# Patient Record
Sex: Female | Born: 1955 | Race: White | Hispanic: No | Marital: Married | Smoking: Never smoker
Health system: Southern US, Community
[De-identification: ages and names within clinical notes are randomized; demographics above are authoritative.]

## PROBLEM LIST (undated history)

## (undated) DIAGNOSIS — Z9889 Other specified postprocedural states: Secondary | ICD-10-CM

## (undated) DIAGNOSIS — R112 Nausea with vomiting, unspecified: Secondary | ICD-10-CM

## (undated) HISTORY — PX: ABDOMINAL HYSTERECTOMY: SHX81

## (undated) HISTORY — PX: DENTAL SURGERY: SHX609

---

## 2000-09-18 ENCOUNTER — Encounter: Admission: RE | Admit: 2000-09-18 | Discharge: 2000-09-18 | Payer: Self-pay | Admitting: Obstetrics and Gynecology

## 2000-09-18 ENCOUNTER — Encounter: Payer: Self-pay | Admitting: Obstetrics and Gynecology

## 2001-09-20 ENCOUNTER — Encounter: Admission: RE | Admit: 2001-09-20 | Discharge: 2001-09-20 | Payer: Self-pay | Admitting: Obstetrics and Gynecology

## 2001-09-20 ENCOUNTER — Encounter: Payer: Self-pay | Admitting: Obstetrics and Gynecology

## 2002-09-26 ENCOUNTER — Encounter: Admission: RE | Admit: 2002-09-26 | Discharge: 2002-09-26 | Payer: Self-pay | Admitting: Obstetrics and Gynecology

## 2002-09-26 ENCOUNTER — Encounter: Payer: Self-pay | Admitting: Obstetrics and Gynecology

## 2003-06-28 HISTORY — PX: KNEE SURGERY: SHX244

## 2003-07-11 ENCOUNTER — Ambulatory Visit (HOSPITAL_COMMUNITY): Admission: RE | Admit: 2003-07-11 | Discharge: 2003-07-11 | Payer: Self-pay | Admitting: Obstetrics and Gynecology

## 2003-07-24 ENCOUNTER — Encounter: Admission: RE | Admit: 2003-07-24 | Discharge: 2003-07-24 | Payer: Self-pay | Admitting: Surgery

## 2003-09-04 ENCOUNTER — Observation Stay (HOSPITAL_COMMUNITY): Admission: RE | Admit: 2003-09-04 | Discharge: 2003-09-05 | Payer: Self-pay | Admitting: Surgery

## 2003-09-04 ENCOUNTER — Encounter (INDEPENDENT_AMBULATORY_CARE_PROVIDER_SITE_OTHER): Payer: Self-pay | Admitting: Specialist

## 2003-10-13 ENCOUNTER — Ambulatory Visit (HOSPITAL_COMMUNITY): Admission: RE | Admit: 2003-10-13 | Discharge: 2003-10-13 | Payer: Self-pay | Admitting: Obstetrics and Gynecology

## 2004-10-13 ENCOUNTER — Ambulatory Visit (HOSPITAL_COMMUNITY): Admission: RE | Admit: 2004-10-13 | Discharge: 2004-10-13 | Payer: Self-pay | Admitting: Obstetrics and Gynecology

## 2005-10-26 ENCOUNTER — Ambulatory Visit (HOSPITAL_COMMUNITY): Admission: RE | Admit: 2005-10-26 | Discharge: 2005-10-26 | Payer: Self-pay | Admitting: Obstetrics and Gynecology

## 2006-11-14 ENCOUNTER — Ambulatory Visit (HOSPITAL_COMMUNITY): Admission: RE | Admit: 2006-11-14 | Discharge: 2006-11-14 | Payer: Self-pay | Admitting: Obstetrics and Gynecology

## 2007-03-28 ENCOUNTER — Ambulatory Visit (HOSPITAL_COMMUNITY): Admission: RE | Admit: 2007-03-28 | Discharge: 2007-03-28 | Payer: Self-pay | Admitting: Surgery

## 2007-11-11 ENCOUNTER — Encounter: Admission: RE | Admit: 2007-11-11 | Discharge: 2007-11-11 | Payer: Self-pay | Admitting: Orthopedic Surgery

## 2007-11-20 ENCOUNTER — Ambulatory Visit (HOSPITAL_COMMUNITY): Admission: RE | Admit: 2007-11-20 | Discharge: 2007-11-20 | Payer: Self-pay | Admitting: Anesthesiology

## 2008-06-27 HISTORY — PX: FOOT SURGERY: SHX648

## 2008-11-20 ENCOUNTER — Ambulatory Visit (HOSPITAL_COMMUNITY): Admission: RE | Admit: 2008-11-20 | Discharge: 2008-11-20 | Payer: Self-pay | Admitting: Obstetrics and Gynecology

## 2009-12-15 ENCOUNTER — Ambulatory Visit (HOSPITAL_COMMUNITY): Admission: RE | Admit: 2009-12-15 | Discharge: 2009-12-15 | Payer: Self-pay | Admitting: Obstetrics and Gynecology

## 2010-11-12 NOTE — Op Note (Signed)
NAME:  Rachel Mccann, Rachel Mccann                        ACCOUNT NO.:  [id number]   MEDICAL RECORD NO.:  [id number]                   PATIENT TYPE:  OBV   LOCATION:  0372                                 FACILITY:  [facility]   PHYSICIAN:  Thornton Park. Daphine Deutscher, M.D.             DATE OF BIRTH:  [birth year]   DATE OF PROCEDURE:  09/04/2003  DATE OF DISCHARGE:                                 OPERATIVE REPORT   PREOPERATIVE DIAGNOSIS:  Recurrent chronic appendicitis.   POSTOPERATIVE DIAGNOSIS:  Recurrent chronic appendicitis with appendix stuck  to the lateral wall of the abdomen.   PROCEDURE:  Laparoscopic appendectomy.   SURGEON:  Thornton Park. Daphine Deutscher, M.D.   ANESTHESIA:  General endotracheal.   DESCRIPTION OF PROCEDURE:  [name] was taken to room #1 where she was given  general anesthesia, a Foley catheter was inserted, and the abdomen was  prepped widely with Betadine-draped sterilely.  A longitudinal incision was  made down into her umbilicus, where I found a small umbilical hernia about 1  cm in diameter.  Hasson technique was used.  The abdomen was insufflated.  We surveyed the abdomen and noted no abnormalities in the pelvis.  In the  cecum, there were inflammatory changes along the side wall, consistent with  acute inflammatory changes.  Went ahead and put a 5 mm port in the right  upper quadrant and a 10/11 obliquely in the left lower quadrant.  All sites  were injected with Marcaine as these were placed.  I then teased the  appendix down, where it was for double back on itself and stuck up to the  side wall.  We then teased it off the cecum inferiorly, as it was fairly  plastered to that structure, and was able to go through the mesentery with a  combination of clips and the harmonic scalpel.  When I had skeletonized the  mesentery of the appendix, I was able to isolate the base and staple across  it with the endo stapler using vascular cartridge.  The appendix was placed  in a bag and brought  out through the umbilicus.  It was sent to pathology,  where Dr. __________ looked at it and did not see any neoplastic structures.  It was all consistent with a chronic appendicitis.   Following irrigation and observation, the bleeding appeared to be  controlled.  I then removed the camera and then worked on the umbilicus.  I  freed up the skin from around the margin of this ring of fascia and placed  three sutures of 2-0 and 0 PDS and approximated the fascia and then closed  the skin over with 4-0 Vicryl.  I then went through  the closure from within, looked in the right lower quadrant.  Irrigated.  Then deflated the abdomen.  Trocars were removed without difficulty.  The  skin was closed with 4-0 Vicryl, Benzoin, and Steri-Strips.  The patient  seemed to  tolerate the procedure well.  She was taken to the recovery room  in satisfactory condition.                                               Thornton Park Daphine Deutscher, M.D.    MBM/MEDQ  D:  09/04/2003  T:  09/04/2003  Job:  [id number]   cc:   Sandria Bales. Ezzard Standing, M.D.  [address]  [place]  [place] [address]  Fax: [phone]   S. Kyra Manges, M.D.  [phone]  [place]  [place] [address]  Fax: [phone]

## 2011-07-06 ENCOUNTER — Other Ambulatory Visit (HOSPITAL_COMMUNITY): Payer: Self-pay | Admitting: Obstetrics and Gynecology

## 2011-07-06 DIAGNOSIS — Z1231 Encounter for screening mammogram for malignant neoplasm of breast: Secondary | ICD-10-CM

## 2011-08-04 ENCOUNTER — Ambulatory Visit (HOSPITAL_COMMUNITY)
Admission: RE | Admit: 2011-08-04 | Discharge: 2011-08-04 | Disposition: A | Discharge status: Home / Self Care | Payer: BC Managed Care – PPO | Source: Ambulatory Visit | Attending: Obstetrics and Gynecology | Admitting: Obstetrics and Gynecology

## 2011-08-04 DIAGNOSIS — Z1231 Encounter for screening mammogram for malignant neoplasm of breast: Secondary | ICD-10-CM | POA: Insufficient documentation

## 2011-12-07 ENCOUNTER — Other Ambulatory Visit: Payer: Self-pay | Admitting: Obstetrics and Gynecology

## 2012-07-12 ENCOUNTER — Other Ambulatory Visit (HOSPITAL_COMMUNITY): Payer: Self-pay | Admitting: Obstetrics and Gynecology

## 2012-07-12 DIAGNOSIS — Z1231 Encounter for screening mammogram for malignant neoplasm of breast: Secondary | ICD-10-CM

## 2012-08-06 ENCOUNTER — Ambulatory Visit (HOSPITAL_COMMUNITY)
Admission: RE | Admit: 2012-08-06 | Discharge: 2012-08-06 | Disposition: A | Discharge status: Home / Self Care | Payer: BC Managed Care – PPO | Source: Ambulatory Visit | Attending: Obstetrics and Gynecology | Admitting: Obstetrics and Gynecology

## 2012-08-06 DIAGNOSIS — Z1231 Encounter for screening mammogram for malignant neoplasm of breast: Secondary | ICD-10-CM

## 2012-12-21 ENCOUNTER — Other Ambulatory Visit: Payer: Self-pay | Admitting: Obstetrics and Gynecology

## 2013-10-10 ENCOUNTER — Other Ambulatory Visit (HOSPITAL_COMMUNITY): Payer: Self-pay | Admitting: Obstetrics and Gynecology

## 2013-10-10 DIAGNOSIS — Z1231 Encounter for screening mammogram for malignant neoplasm of breast: Secondary | ICD-10-CM

## 2013-10-11 ENCOUNTER — Ambulatory Visit (HOSPITAL_COMMUNITY)
Admission: RE | Admit: 2013-10-11 | Discharge: 2013-10-11 | Disposition: A | Discharge status: Home / Self Care | Payer: 59 | Source: Ambulatory Visit | Attending: Obstetrics and Gynecology | Admitting: Obstetrics and Gynecology

## 2013-10-11 DIAGNOSIS — Z1231 Encounter for screening mammogram for malignant neoplasm of breast: Secondary | ICD-10-CM | POA: Insufficient documentation

## 2013-11-26 ENCOUNTER — Ambulatory Visit (INDEPENDENT_AMBULATORY_CARE_PROVIDER_SITE_OTHER): Payer: 59

## 2013-11-26 VITALS — BP 102/62 | HR 65 | Resp 13 | Ht 66.0 in | Wt 170.0 lb

## 2013-11-26 DIAGNOSIS — M25473 Effusion, unspecified ankle: Secondary | ICD-10-CM

## 2013-11-26 DIAGNOSIS — M25476 Effusion, unspecified foot: Secondary | ICD-10-CM

## 2013-11-26 DIAGNOSIS — G5760 Lesion of plantar nerve, unspecified lower limb: Secondary | ICD-10-CM

## 2013-11-26 DIAGNOSIS — M779 Enthesopathy, unspecified: Secondary | ICD-10-CM

## 2013-11-26 DIAGNOSIS — M7750 Other enthesopathy of unspecified foot: Secondary | ICD-10-CM

## 2013-11-26 DIAGNOSIS — M778 Other enthesopathies, not elsewhere classified: Secondary | ICD-10-CM

## 2013-11-26 MED ORDER — MELOXICAM 15 MG PO TABS: 15.0000 mg | ORAL_TABLET | Freq: Every day | ORAL | Status: DC

## 2013-11-26 NOTE — Progress Notes (Deleted)
   Subjective:    Patient ID: Rachel Mccann, female    DOB: March 11, 1956, 58 y.o.   MRN: [id number]  HPI    Review of Systems  Musculoskeletal: Positive for arthralgias and gait problem.  All other systems reviewed and are negative.      Objective:   Physical Exam        Assessment & Plan:

## 2013-11-26 NOTE — Patient Instructions (Signed)

## 2013-11-26 NOTE — Progress Notes (Signed)
   Subjective:    Patient ID: Rachel Mccann, female    DOB: [birth year], 58 y.o.   MRN: [id number]  HPI Comments: N toe and forefoot pain L left 2nd toe and 2nd MPJ area plantar D 4 months O on and off C tightness, thickness at 2nd MPJ A no known trigger T Ice and heat, soaks, Ibuprofen  Pt states feet may be a little swollen to due flight last night.  Foot Pain Associated symptoms include arthralgias.      Review of Systems  Musculoskeletal: Positive for arthralgias and back pain.  All other systems reviewed and are negative.      Objective:   Physical Exam Neurovascular status is intact pedal pulses palpable epicritic and proprioceptive sensations intact and symmetric bilateral is normal plantar response and DTRs there is no history of injury trauma or contusion but may have extended her second toe which is slightly longer than the hallux there is pain on resisted plantarflexion and any temperature flexion plantar flexion the second MTP area left foot there is also some pain second interspace on direct lateral compression posteriorly neuroma pedal pulses otherwise palpable epicritic and proprioceptive sensations intact and symmetrical bilateral x-rays reveal no signs of fracture or osseous abnormality or soft tissue swelling and clinical exam of second MTP joint due to plantar capsular joint appears to be painful tender and edematous and tender on direct palpation. Patient also some diffuse keratoses mild digital contractures otherwise noted. 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 although there is lateral deviation hallux starting to push against the second digits which may be causing capsulitis      Assessment & Plan:  Assessment capsulitis second MTP joint bilateral possible pre-dislocation syndrome second MTP joint with early neuroma symptomology and edema and soft tissue swelling around second MTP joint plan at this time patient placed on a regimen  of MOBIC 15 minutes once daily indicate Advil helps slightly a little trauma but which is slightly stronger if no improvement consider a Sterapred for steroid dose pack as alternative possibly the future steroid injection may be considered at this time fascial strapping her digital splinting of the second toe is foot into plantar grade position utilizing a Elastoplast and cover tape patient is instructed on strapping the digit to maintain integrity plantar capsule recheck in 3 or 4 weeks for followup maintain a stiff soled shoe and the ice pack every you every day.  Purchase Miral [name] DPM

## 2013-12-11 ENCOUNTER — Ambulatory Visit (INDEPENDENT_AMBULATORY_CARE_PROVIDER_SITE_OTHER): Payer: 59 | Admitting: Podiatrist

## 2013-12-11 ENCOUNTER — Encounter: Payer: Self-pay | Admitting: Podiatrist

## 2013-12-11 VITALS — BP 93/57 | HR 71 | Resp 16

## 2013-12-11 DIAGNOSIS — M779 Enthesopathy, unspecified: Principal | ICD-10-CM

## 2013-12-11 DIAGNOSIS — M7750 Other enthesopathy of unspecified foot: Secondary | ICD-10-CM

## 2013-12-11 DIAGNOSIS — M778 Other enthesopathies, not elsewhere classified: Secondary | ICD-10-CM

## 2013-12-13 NOTE — Progress Notes (Signed)
Subjective: Patient presents today for followup of capsulitis second metatarsophalangeal joint and second digit of the left foot. She [facility] and [facility] the toe has been helping. She states she's been wearing stiff soled shoes however today she presents in a pair of cross type sandals. She relates that these are comfortable for her.  Objective: Neurovascular status is intact. Some generalized discomfort submetatarsal 2 and second digit is noted. Consistent with pre-dislocation injury.  Assessment: Pre-dislocation syndrome left  Plan: Recommended continued [facility] of [facility], and use of splinting for one to 2 more weeks. If it returns she will call we will consider a Sterapred Dosepak. May also be a candidate for orthotics in the future.

## 2013-12-23 ENCOUNTER — Other Ambulatory Visit: Payer: Self-pay | Admitting: Obstetrics and Gynecology

## 2013-12-24 LAB — CYTOLOGY - PAP

## 2014-02-10 ENCOUNTER — Telehealth: Payer: Self-pay | Admitting: *Deleted

## 2014-02-10 NOTE — Telephone Encounter (Signed)
I just need to see if I can get a refill for Meloxicam.  Thank You!

## 2014-02-11 ENCOUNTER — Telehealth: Payer: Self-pay | Admitting: *Deleted

## 2014-02-11 MED ORDER — MELOXICAM 15 MG PO TABS: 15.0000 mg | ORAL_TABLET | Freq: Every day | ORAL | Status: DC

## 2014-02-11 NOTE — Telephone Encounter (Signed)
Calling about a prescription refill for Meloxicam.  I'm still having issues with my foot.  I want to see if that can be arranged.

## 2014-02-11 NOTE — Telephone Encounter (Signed)
rx for meloxicam called into her pharmacy.  thanks

## 2014-02-11 NOTE — Telephone Encounter (Signed)
I called and informed the patient that Dr. Irving ShowsEgerton sent her prescription for Mobic to the pharmacy.  She stated oh great!  Thank you so much.

## 2014-08-01 ENCOUNTER — Other Ambulatory Visit: Payer: Self-pay | Admitting: Dermatology

## 2014-09-12 ENCOUNTER — Other Ambulatory Visit (HOSPITAL_COMMUNITY): Payer: Self-pay | Admitting: Obstetrics and Gynecology

## 2014-09-12 DIAGNOSIS — Z1231 Encounter for screening mammogram for malignant neoplasm of breast: Secondary | ICD-10-CM

## 2014-10-13 ENCOUNTER — Ambulatory Visit (HOSPITAL_COMMUNITY)
Admission: RE | Admit: 2014-10-13 | Discharge: 2014-10-13 | Disposition: A | Discharge status: Home / Self Care | Payer: 59 | Source: Ambulatory Visit | Attending: Obstetrics and Gynecology | Admitting: Obstetrics and Gynecology

## 2014-10-13 DIAGNOSIS — Z1231 Encounter for screening mammogram for malignant neoplasm of breast: Secondary | ICD-10-CM | POA: Insufficient documentation

## 2015-01-15 ENCOUNTER — Other Ambulatory Visit: Payer: Self-pay | Admitting: Obstetrics and Gynecology

## 2015-01-19 LAB — CYTOLOGY - PAP

## 2016-02-19 ENCOUNTER — Ambulatory Visit (INDEPENDENT_AMBULATORY_CARE_PROVIDER_SITE_OTHER): Payer: 59 | Admitting: Podiatry

## 2016-02-19 ENCOUNTER — Ambulatory Visit: Payer: Self-pay

## 2016-02-19 ENCOUNTER — Encounter: Payer: Self-pay | Admitting: Podiatry

## 2016-02-19 ENCOUNTER — Ambulatory Visit (INDEPENDENT_AMBULATORY_CARE_PROVIDER_SITE_OTHER): Payer: 59

## 2016-02-19 VITALS — BP 97/62 | HR 74 | Resp 16

## 2016-02-19 DIAGNOSIS — M21619 Bunion of unspecified foot: Secondary | ICD-10-CM

## 2016-02-19 DIAGNOSIS — M79672 Pain in left foot: Secondary | ICD-10-CM

## 2016-02-19 DIAGNOSIS — M7752 Other enthesopathy of left foot: Secondary | ICD-10-CM | POA: Diagnosis not present

## 2016-02-19 DIAGNOSIS — M778 Other enthesopathies, not elsewhere classified: Secondary | ICD-10-CM

## 2016-02-19 DIAGNOSIS — M779 Enthesopathy, unspecified: Secondary | ICD-10-CM

## 2016-02-19 DIAGNOSIS — M79671 Pain in right foot: Secondary | ICD-10-CM

## 2016-02-19 MED ORDER — TRIAMCINOLONE ACETONIDE 10 MG/ML IJ SUSP
10.0000 mg | Freq: Once | INTRAMUSCULAR | Status: AC
  Administered 2016-02-19: 10 mg

## 2016-02-19 NOTE — Progress Notes (Addendum)
Subjective:     Patient ID: Rachel Mccann, female   DOB: 01/06/1956, 60 y.o.   MRN: [id number]  HPI patient presents stating she's concerned about her structural bunion on the left and she has a lot of discomfort in the joint of the left second metatarsal and on the outside of the left foot where she admits she's been walking differently   Review of Systems     Objective:   Physical Exam Neurovascular status found to be intact with muscle strength adequate range of motion within normal limits with patient found to have moderate structural bunion deformity left exquisite discomfort second metatarsophalangeal joint left with no digital displacement at the current time and pain in the peroneal insertion fifth metatarsal left with no indication of tendon dysfunction    Assessment:     Inflammatory capsulitis second MPJ left with long-term structural bunion deformity left and tendinitis peroneal which is most likely compensation in nature    Plan:     H&P x-rays reviewed and did a proximal nerve block left and then aspirated the second MPJ getting out of small amount of clear fluid and injected quarter cc dexamethasone Kenalog into the joint and injected the sheath of the tendon peroneal left 3 mg Kenalog 5 mg Xylocaine and instructed on reduced activity. Dispensed fascial brace to lift up the lateral side of the foot and reappoint in 2-3 weeks to see response    X-ray report indicates that there is a structural bunion deformity left with elevation of the angle and deviation the hallux against the second toe with patient also noted to have no indications of stress fracture digital displacement or other pathology

## 2016-03-03 ENCOUNTER — Encounter: Payer: Self-pay | Admitting: Podiatry

## 2016-03-03 ENCOUNTER — Ambulatory Visit (INDEPENDENT_AMBULATORY_CARE_PROVIDER_SITE_OTHER): Payer: 59 | Admitting: Podiatry

## 2016-03-03 DIAGNOSIS — M779 Enthesopathy, unspecified: Principal | ICD-10-CM

## 2016-03-03 DIAGNOSIS — M21619 Bunion of unspecified foot: Secondary | ICD-10-CM | POA: Diagnosis not present

## 2016-03-03 DIAGNOSIS — M778 Other enthesopathies, not elsewhere classified: Secondary | ICD-10-CM

## 2016-03-03 DIAGNOSIS — M7752 Other enthesopathy of left foot: Secondary | ICD-10-CM | POA: Diagnosis not present

## 2016-03-04 NOTE — Progress Notes (Signed)
Subjective:     Patient ID: Rachel Mccann, female   DOB: 19-Oct-1955, 60 y.o.   MRN: [id number]  HPI patient presents stating I'm doing well but I am still having some pain if I do a lot of walking   Review of Systems     Objective:   Physical Exam Neurovascular status intact with continued mild to moderate discomfort around the second MPJ left that's improved from previous but still present with patient noted to have deformity left    Assessment:     Low-grade inflammatory process second MPJ left that's improved but still present with mechanical dysfunction    Plan:     Advised on the importance of shoe gear usage and rigid bottom shoes and continued dispensing metatarsal pads at this time to reduce pressure and reappoint 4 weeks and may ultimately require shortening-type osteotomy

## 2016-03-31 ENCOUNTER — Ambulatory Visit (INDEPENDENT_AMBULATORY_CARE_PROVIDER_SITE_OTHER): Payer: 59 | Admitting: Podiatry

## 2016-03-31 ENCOUNTER — Ambulatory Visit: Payer: 59 | Admitting: Podiatry

## 2016-03-31 ENCOUNTER — Encounter: Payer: Self-pay | Admitting: Podiatry

## 2016-03-31 DIAGNOSIS — M7752 Other enthesopathy of left foot: Secondary | ICD-10-CM | POA: Diagnosis not present

## 2016-03-31 DIAGNOSIS — M21619 Bunion of unspecified foot: Secondary | ICD-10-CM

## 2016-03-31 DIAGNOSIS — M779 Enthesopathy, unspecified: Principal | ICD-10-CM

## 2016-03-31 DIAGNOSIS — M778 Other enthesopathies, not elsewhere classified: Secondary | ICD-10-CM

## 2016-04-01 NOTE — Progress Notes (Signed)
Subjective:     Patient ID: Rachel Mccann, female   DOB: July 12, 1955, 60 y.o.   MRN: [id number]  HPI patient states the pain has improved but if she tries to be active or wear different types of shoes she still has quite a bit of discomfort   Review of Systems     Objective:   Physical Exam Neurovascular status intact muscle strength adequate and patient found to have continued discomfort second MPJ left with fluid buildup around the joint and mild dislocation of the toe with structural bunion deformity also noted    Assessment:     Inflammatory condition secondary MPJ left that's improved but still present with mechanical dysfunction and structural dysfunction with bunion deformity noted    Plan:     H&P condition reviewed and went ahead and I've recommended orthotics for this patient to try to reduce stress against the joint. I did discuss long-term this may require surgical intervention but I think at this point it's premature and I'm hoping to control with offloading. Patient was scanned for custom orthotics and will be seen back when they are ready

## 2016-05-04 ENCOUNTER — Ambulatory Visit: Payer: 59 | Admitting: Podiatry

## 2016-05-11 ENCOUNTER — Ambulatory Visit (INDEPENDENT_AMBULATORY_CARE_PROVIDER_SITE_OTHER): Payer: 59 | Admitting: Podiatry

## 2016-05-11 ENCOUNTER — Encounter: Payer: Self-pay | Admitting: Podiatry

## 2016-05-11 DIAGNOSIS — M778 Other enthesopathies, not elsewhere classified: Secondary | ICD-10-CM

## 2016-05-11 DIAGNOSIS — M21619 Bunion of unspecified foot: Secondary | ICD-10-CM

## 2016-05-11 DIAGNOSIS — M779 Enthesopathy, unspecified: Principal | ICD-10-CM

## 2016-05-11 DIAGNOSIS — M7752 Other enthesopathy of left foot: Secondary | ICD-10-CM

## 2016-05-11 NOTE — Patient Instructions (Signed)

## 2016-05-12 NOTE — Progress Notes (Signed)
Subjective:     Patient ID: Rachel Mccann, female   DOB: 1956/01/16, 60 y.o.   MRN: [id number]  HPI presents for orthotic pickup   Review of Systems     Objective:   Physical Exam Moderate discomfort left    Assessment:     Capsulitis    Plan:     Dispensed orthotics with instructions

## 2016-05-27 ENCOUNTER — Telehealth: Payer: Self-pay | Admitting: *Deleted

## 2016-05-27 NOTE — Telephone Encounter (Signed)
Pt states Dr. Charlsie Merlesegal stated if she was having pain to call the office. Left message instructing pt to go back into a stiff bottom shoe, and ice, make another appt.

## 2017-01-25 ENCOUNTER — Encounter: Payer: Self-pay | Admitting: Podiatry

## 2017-01-25 ENCOUNTER — Ambulatory Visit (INDEPENDENT_AMBULATORY_CARE_PROVIDER_SITE_OTHER): Payer: 59 | Admitting: Podiatry

## 2017-01-25 VITALS — BP 96/61 | HR 78 | Resp 16

## 2017-01-25 DIAGNOSIS — M7752 Other enthesopathy of left foot: Secondary | ICD-10-CM | POA: Diagnosis not present

## 2017-01-25 DIAGNOSIS — M779 Enthesopathy, unspecified: Principal | ICD-10-CM

## 2017-01-25 DIAGNOSIS — M778 Other enthesopathies, not elsewhere classified: Secondary | ICD-10-CM

## 2017-01-25 MED ORDER — TRIAMCINOLONE ACETONIDE 10 MG/ML IJ SUSP
10.0000 mg | Freq: Once | INTRAMUSCULAR | Status: AC
  Administered 2017-01-25: 10 mg

## 2017-01-25 NOTE — Progress Notes (Signed)
Subjective:    Patient ID: Rachel BlonderPatrice M Mccann, female   DOB: 61 y.o.   MRN: [id number]   HPI patient presents stating she has a lot of pain around her second metatarsal joint left that's inflamed and painful when palpated    ROS      Objective:  Physical Exam neurovascular status intact with inflammatory capsulitis of the second MPJ left    Assessment:   Chronic capsulitis second MPJ left      Plan:  Proximal nerve block administered aspirated the joint getting out a small amount of clear fluid and injected with quarter cc deck some some Kenalog and applied thick padding to reduce pressure on the joint surface. Reappoint to recheck

## 2017-02-15 ENCOUNTER — Other Ambulatory Visit: Payer: Self-pay | Admitting: Gastroenterology

## 2017-03-17 ENCOUNTER — Encounter (HOSPITAL_COMMUNITY): Payer: Self-pay | Admitting: *Deleted

## 2017-03-17 ENCOUNTER — Encounter (HOSPITAL_COMMUNITY)
Admission: RE | Disposition: A | Discharge status: Home / Self Care | Payer: Self-pay | Source: Ambulatory Visit | Attending: Gastroenterology

## 2017-03-17 ENCOUNTER — Ambulatory Visit (HOSPITAL_COMMUNITY)
Admission: RE | Admit: 2017-03-17 | Discharge: 2017-03-17 | Disposition: A | Discharge status: Home / Self Care | Payer: 59 | Source: Ambulatory Visit | Attending: Gastroenterology | Admitting: Gastroenterology

## 2017-03-17 DIAGNOSIS — K640 First degree hemorrhoids: Secondary | ICD-10-CM | POA: Insufficient documentation

## 2017-03-17 DIAGNOSIS — Z791 Long term (current) use of non-steroidal anti-inflammatories (NSAID): Secondary | ICD-10-CM | POA: Insufficient documentation

## 2017-03-17 DIAGNOSIS — K621 Rectal polyp: Secondary | ICD-10-CM | POA: Diagnosis not present

## 2017-03-17 DIAGNOSIS — Z8719 Personal history of other diseases of the digestive system: Secondary | ICD-10-CM | POA: Diagnosis not present

## 2017-03-17 DIAGNOSIS — Z880 Allergy status to penicillin: Secondary | ICD-10-CM | POA: Diagnosis not present

## 2017-03-17 HISTORY — PX: HOT HEMOSTASIS: SHX5433

## 2017-03-17 HISTORY — PX: FLEXIBLE SIGMOIDOSCOPY: SHX5431

## 2017-03-17 SURGERY — SIGMOIDOSCOPY, FLEXIBLE
Anesthesia: Moderate Sedation

## 2017-03-17 MED ORDER — FENTANYL CITRATE (PF) 100 MCG/2ML IJ SOLN
INTRAMUSCULAR | Status: AC
  Filled 2017-03-17: qty 2

## 2017-03-17 MED ORDER — SODIUM CHLORIDE 0.9 % IV SOLN
INTRAVENOUS | Status: DC
  Administered 2017-03-17: 500 mL via INTRAVENOUS

## 2017-03-17 MED ORDER — DIPHENHYDRAMINE HCL 50 MG/ML IJ SOLN
INTRAMUSCULAR | Status: AC
  Filled 2017-03-17: qty 1

## 2017-03-17 MED ORDER — FENTANYL CITRATE (PF) 100 MCG/2ML IJ SOLN
INTRAMUSCULAR | Status: DC | PRN
  Administered 2017-03-17 (×2): 25 ug via INTRAVENOUS

## 2017-03-17 MED ORDER — MIDAZOLAM HCL 5 MG/ML IJ SOLN
INTRAMUSCULAR | Status: AC
Start: 2017-03-17 — End: ?
  Filled 2017-03-17: qty 2

## 2017-03-17 MED ORDER — MIDAZOLAM HCL 10 MG/2ML IJ SOLN
INTRAMUSCULAR | Status: DC | PRN
  Administered 2017-03-17 (×2): 1 mg via INTRAVENOUS
  Administered 2017-03-17: 2 mg via INTRAVENOUS

## 2017-03-17 NOTE — Op Note (Signed)
[facility] Patient Name: Rachel Mccann Procedure Date: 03/17/2017 MRN: [id number] Attending MD: Tresea Mall Dr., MD Date of Birth: 1956/01/14 CSN: [id number] Age: 61 Admit Type: Outpatient Procedure:                Flexible Sigmoidoscopy Indications:              Personal history of rectal adenoma Providers:                Fayrene Fearing L. Randa Evens Dr., MD, Janae Sauce. Steele Berg, RN,                            Jacqulyn Liner, Technician Referring MD:              Medicines:                Fentanyl 50 micrograms IV, Midazolam 4 mg IV Complications:            No immediate complications. Estimated Blood Loss:     Estimated blood loss: none. Procedure:                Pre-Anesthesia Assessment:                           - Prior to the procedure, a History and Physical                            was performed, and patient medications and                            allergies were reviewed. The patient's tolerance of                            previous anesthesia was also reviewed. The risks                            and benefits of the procedure and the sedation                            options and risks were discussed with the patient.                            All questions were answered, and informed consent                            was obtained. Prior Anticoagulants: The patient has                            taken no previous anticoagulant or antiplatelet                            agents. ASA Grade Assessment: I - A normal, healthy                            patient. After reviewing the risks and benefits,  the patient was deemed in satisfactory condition to                            undergo the procedure.                           After obtaining informed consent, the scope was                            passed under direct vision. The EC-[id number] ([id number])                            scope was introduced through the anus and advanced                 to the the sigmoid colon.We advanced to 35 cm. The                            flexible sigmoidoscopy was accomplished without                            difficulty. The patient tolerated the procedure                            well. Scope In: Scope Out: Findings:      The perianal and digital rectal examinations were normal.      A 3 mm polyp was found in the rectum. The polyp was sessile. Fulguration       to ablate the lesion by argon plasma was successful. The residual polyp       was very near her very large hemorrhoids. In fact most distal part       actually appeared to be partially on one of the hemorrhoidal piles.. The       area was vigorously fulgerated with the circumferential probe. Retroflex       view did reveal very large internal hemorrhoids.      Non-bleeding internal hemorrhoids were found during retroflexion. The       hemorrhoids were large and Grade I (internal hemorrhoids that do not       prolapse).      Scattered small-mouthed diverticula were found in the sigmoid colon. Impression:               - One 3 mm polyp in the rectum. Treated with argon                            plasma coagulation (APC).                           - Non-bleeding internal hemorrhoids.                           - No specimens collected. Moderate Sedation:      Moderate (conscious) sedation was administered by the endoscopy nurse       and supervised by the endoscopist. The following parameters were       monitored: oxygen saturation, heart rate, blood pressure, respiratory       rate, EKG, adequacy of  pulmonary ventilation, and response to care. Recommendation:           - Discharge patient to home (ambulatory).                           - Resume previous diet.                           - Continue present medications.                           - Miralax 1 capful (17 grams) in 8 ounces of water                            PO PRN.                           - Repeat flexible  sigmoidoscopy in 6 months for                            surveillance. Procedure Code(s):        --- Professional ---                           [phone], Sigmoidoscopy, flexible; with ablation of                            tumor(s), polyp(s), or other lesion(s) (includes                            pre- and post-dilation and guide wire passage, when                            performed) Diagnosis Code(s):        --- Professional ---                           K62.1, Rectal polyp                           K64.0, First degree hemorrhoids                           Z86.018, Personal history of other benign neoplasm CPT copyright 2016 [employer]. All rights reserved. The codes documented in this report are preliminary and upon coder review may  be revised to meet current compliance requirements. Tresea Mall Dr., MD 03/17/2017 1:48:30 PM This report has been signed electronically. Number of Addenda: 0

## 2017-03-17 NOTE — H&P (Signed)
Subjective:   Patient is a 61 y.o. female presents with Need for follow-up of rectal polyp. This was initially removed by colonoscopy path revealed this to be adenoma. No dysplasia in the polyp. The polyp was large initially approximately 2 1/2 to 3 cm in the path was a serrated adenoma. 3 months later there is still some slight residual polyp in the distal rectum. This procedure is done to evaluate and remove any residual polyp and to formulate the area with the APC. Procedure including risks and benefits discussed in office.  There are no active problems to display for this patient.  History reviewed. No pertinent past medical history.  Past Surgical History:  Procedure Laterality Date  . ABDOMINAL HYSTERECTOMY    . DENTAL SURGERY    . FOOT SURGERY Right 2010  . KNEE SURGERY Right 2005    Prescriptions Prior to Admission  Medication Sig Dispense Refill Last Dose  . Biotin 1000 MCG tablet Take 1,000 mcg by mouth daily.   03/17/2017 at Unknown time  . naproxen sodium (ANAPROX) 220 MG tablet Take 220 mg by mouth daily as needed (pain).   03/17/2017 at Unknown time  . VITAMIN D, CHOLECALCIFEROL, PO Take 1 tablet by mouth daily.   03/17/2017 at Unknown time  . VIVELLE-DOT 0.0375 MG/24HR Apply 1 patch topically every 3 (three) days.   03/17/2017 at Unknown time   Allergies  Allergen Reactions  . Penicillins Rash    Severe rash as a child Has patient had a PCN reaction causing immediate rash, facial/tongue/throat swelling, SOB or lightheadedness with hypotension: No Has patient had a PCN reaction causing severe rash involving mucus membranes or skin necrosis: No Has patient had a PCN reaction that required hospitalization: No Has patient had a PCN reaction occurring within the last 10 years: No If all of the above answers are "NO", then may proceed with Cephalosporin use.     Social History  Substance Use Topics  . Smoking status: Never Smoker  . Smokeless tobacco: Never Used  . Alcohol  use Yes     Comment: rare    History reviewed. No pertinent family history.   Objective:   Patient Vitals for the past 8 hrs:  BP Temp Temp src Pulse Resp SpO2 Height Weight  03/17/17 1236 124/66 (!) 97.5 F (36.4 C) Oral 71 10 98 %  (1.676 m) 77.1 kg (170 lb)   No intake/output data recorded. No intake/output data recorded.   See MD Preop evaluation      Assessment:   1. Rectal polyp.  Plan:   We will proceed with flexible sigmoidoscopy with polypectomy and fulgaration of any residual polyp with the APC the procedure has been discussed in detail with the patient and her husband

## 2017-03-20 ENCOUNTER — Encounter (HOSPITAL_COMMUNITY): Payer: Self-pay | Admitting: Gastroenterology

## 2017-07-05 ENCOUNTER — Encounter: Payer: Self-pay | Admitting: Podiatry

## 2017-07-05 ENCOUNTER — Ambulatory Visit (INDEPENDENT_AMBULATORY_CARE_PROVIDER_SITE_OTHER): Payer: 59 | Admitting: Podiatry

## 2017-07-05 DIAGNOSIS — M778 Other enthesopathies, not elsewhere classified: Secondary | ICD-10-CM

## 2017-07-05 DIAGNOSIS — M7752 Other enthesopathy of left foot: Secondary | ICD-10-CM

## 2017-07-05 DIAGNOSIS — M779 Enthesopathy, unspecified: Principal | ICD-10-CM

## 2017-07-05 MED ORDER — TRIAMCINOLONE ACETONIDE 10 MG/ML IJ SUSP
10.0000 mg | Freq: Once | INTRAMUSCULAR | Status: AC
  Administered 2017-07-05: 10 mg

## 2017-07-05 NOTE — Progress Notes (Signed)
Subjective:   Patient ID: Rachel BlonderPatrice M Lemme, female   DOB: 62 y.o.   MRN: [id number]   HPI Patient states the joint has started to bother her again and she is getting ready to go out of town   ROS      Objective:  Physical Exam  Second MPJ left is improved but there is still discomfort when I palpated the joint deeply     Assessment:  Inflammatory capsulitis second MPJ left     Plan:  Proximal nerve block administered aspirating the joint getting on a small amount of clear fluid and injected with 1/4 cc of dexamethasone Kenalog and applied thick padding to reduce pressure on the joint surface.  Explained some day this may require structural bunion correction and shortening osteotomy of the second metatarsal

## 2017-08-10 ENCOUNTER — Other Ambulatory Visit: Payer: Self-pay | Admitting: Gastroenterology

## 2017-08-18 ENCOUNTER — Ambulatory Visit (HOSPITAL_COMMUNITY): Admit: 2017-08-18 | Payer: 59 | Admitting: Gastroenterology

## 2017-08-18 ENCOUNTER — Encounter (HOSPITAL_COMMUNITY): Payer: Self-pay | Admitting: *Deleted

## 2017-08-18 ENCOUNTER — Encounter (HOSPITAL_COMMUNITY): Payer: Self-pay

## 2017-08-18 ENCOUNTER — Other Ambulatory Visit: Payer: Self-pay

## 2017-08-18 ENCOUNTER — Encounter (HOSPITAL_COMMUNITY)
Admission: RE | Disposition: A | Discharge status: Home / Self Care | Payer: Self-pay | Source: Ambulatory Visit | Attending: Gastroenterology

## 2017-08-18 ENCOUNTER — Ambulatory Visit (HOSPITAL_COMMUNITY)
Admission: RE | Admit: 2017-08-18 | Discharge: 2017-08-18 | Disposition: A | Discharge status: Home / Self Care | Payer: 59 | Source: Ambulatory Visit | Attending: Gastroenterology | Admitting: Gastroenterology

## 2017-08-18 DIAGNOSIS — K5730 Diverticulosis of large intestine without perforation or abscess without bleeding: Secondary | ICD-10-CM | POA: Diagnosis not present

## 2017-08-18 DIAGNOSIS — Z09 Encounter for follow-up examination after completed treatment for conditions other than malignant neoplasm: Secondary | ICD-10-CM | POA: Diagnosis not present

## 2017-08-18 DIAGNOSIS — Z86010 Personal history of colonic polyps: Secondary | ICD-10-CM | POA: Diagnosis not present

## 2017-08-18 DIAGNOSIS — K640 First degree hemorrhoids: Secondary | ICD-10-CM | POA: Insufficient documentation

## 2017-08-18 DIAGNOSIS — Z8719 Personal history of other diseases of the digestive system: Secondary | ICD-10-CM | POA: Insufficient documentation

## 2017-08-18 HISTORY — PX: FLEXIBLE SIGMOIDOSCOPY: SHX5431

## 2017-08-18 SURGERY — SIGMOIDOSCOPY, FLEXIBLE
Anesthesia: Moderate Sedation

## 2017-08-18 MED ORDER — MIDAZOLAM HCL 10 MG/2ML IJ SOLN
INTRAMUSCULAR | Status: DC | PRN
  Administered 2017-08-18 (×2): 2 mg via INTRAVENOUS

## 2017-08-18 MED ORDER — FENTANYL CITRATE (PF) 100 MCG/2ML IJ SOLN
INTRAMUSCULAR | Status: AC
  Filled 2017-08-18: qty 4

## 2017-08-18 MED ORDER — MIDAZOLAM HCL 5 MG/ML IJ SOLN
INTRAMUSCULAR | Status: AC
  Filled 2017-08-18: qty 3

## 2017-08-18 MED ORDER — SODIUM CHLORIDE 0.9 % IV SOLN
INTRAVENOUS | Status: DC
  Administered 2017-08-18: 500 mL via INTRAVENOUS

## 2017-08-18 MED ORDER — FENTANYL CITRATE (PF) 100 MCG/2ML IJ SOLN
INTRAMUSCULAR | Status: DC | PRN
  Administered 2017-08-18 (×2): 25 ug via INTRAVENOUS

## 2017-08-18 NOTE — Op Note (Signed)
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 The path report                            showed serrated adenoma. Sigmoidoscopy was                            performed 9/18 revealing slight residual adenoma                            extending into the anal canal. The patient very                            large internal hemorrhoids. All of the residual                            polyp was fulgerated with the APC. This is a 6                            month follow-up to make certain that all the polyp                            is been adequately removed. Providers:                Llana AlimentJames L. Jakyiah Briones Dr., MD, Cathlean Marseillesebi Mays, RN, Zoila ShutterGary                            Bryant, Technician Referring MD:              Medicines:                Fentanyl 50 micrograms IV, Midazolam 4 mg IV Complications:            No immediate complications. Estimated Blood Loss:     Estimated blood loss: none. Procedure:                Pre-Anesthesia Assessment:                           - Prior to the procedure, a History and Physical                            was performed, and patient medications and                            allergies were reviewed. The patient's tolerance of  previous anesthesia was also reviewed. The risks                            and benefits of the procedure and the sedation                            options and risks were discussed with the patient.                            All questions were answered, and informed consent                            was obtained. Prior Anticoagulants: The patient has               taken no previous anticoagulant or antiplatelet                            agents. ASA Grade Assessment: I - A normal, healthy                            patient. After reviewing the risks and benefits,                            the patient was deemed in satisfactory condition to                            undergo the procedure.                           After obtaining informed consent, the scope was                            passed under direct vision. The EC-[id number] ([id number])                            scope was introduced through the anus and advanced                            to the the sigmoid colon. The flexible                            sigmoidoscopy was accomplished without difficulty.                            The patient tolerated the procedure well. The                            quality of the bowel preparation was good. Scope In: Scope Out: Findings:      The perianal and digital rectal examinations were normal.      There is no endoscopic evidence of bleeding, mass, polyps or tumor in       the rectum, in the recto-sigmoid colon and in the sigmoid colon. We were       able to advance to  approximately 30 cm and there was some solid stool at       that distance. The area of the prior rectal polyp was carefully examined       in the forward and retroflex view there were no signs of any residual       polyp.      Non-bleeding internal hemorrhoids were found during retroflexion. The       hemorrhoids were large and Grade I (internal hemorrhoids that do not       prolapse).      A few small-mouthed diverticula were found in the sigmoid colon. Impression:               - Non-bleeding internal hemorrhoids.                           - Diverticulosis in the sigmoid colon.                           - No specimens collected.                           - Personal history of colonic polyps. The rectal                            polyp has adequately been treated note was  no sign                            of any residual polyp. Moderate Sedation:      Moderate (conscious) sedation was administered by the endoscopy nurse       and supervised by the endoscopist. The following parameters were       monitored: oxygen saturation, heart rate, blood pressure, respiratory       rate, EKG, adequacy of pulmonary ventilation, and response to care. Recommendation:           - Discharge patient to home (ambulatory).                           - Resume regular diet.                           - Continue present medications.                           - Perform a colonoscopy in 2 years. Procedure Code(s):        --- Professional ---                           [phone], Sigmoidoscopy, flexible; diagnostic,                            including collection of specimen(s) by brushing or                            washing, when performed (separate procedure) Diagnosis Code(s):        --- Professional ---  Z86.010, Personal history of colonic polyps                           K64.0, First degree hemorrhoids                           K57.30, Diverticulosis of large intestine without                            perforation or abscess without bleeding CPT copyright 2016 [employer]. All rights reserved. The codes documented in this report are preliminary and upon coder review may  be revised to meet current compliance requirements. Tresea Mall Dr., MD 08/18/2017 11:45:11 AM This report has been signed electronically. Number of Addenda: 0

## 2017-08-18 NOTE — H&P (Signed)
Subjective:   Patient is a 62 y.o. female presents with history a rectal polyp that was serrated. It was not clear that all of this should been removed so 9/18 flexible sigmoidoscopy was performed in the whole area was fulgerated with the circumferential probe with the APC. This is done is a six-month follow-up to make sure that all the polyp has been removed.. Procedure including risks and benefits discussed in office.  There are no active problems to display for this patient.  History reviewed. No pertinent past medical history.  Past Surgical History:  Procedure Laterality Date  . ABDOMINAL HYSTERECTOMY    . DENTAL SURGERY    . FLEXIBLE SIGMOIDOSCOPY N/A 03/17/2017   Procedure: FLEXIBLE SIGMOIDOSCOPY;  Surgeon: Carman ChingEdwards, Kimetha Trulson, MD;  Location: [facility];  Service: Endoscopy;  Laterality: N/A;  . FOOT SURGERY Right 2010  . HOT HEMOSTASIS N/A 03/17/2017   Procedure: HOT HEMOSTASIS (ARGON PLASMA COAGULATION/BICAP);  Surgeon: Carman ChingEdwards, Antavion Bartoszek, MD;  [facility];  Service: Endoscopy;  Laterality: N/A;  . KNEE SURGERY Right 2005    Medications Prior to Admission  Medication Sig Dispense Refill Last Dose  . Biotin 1000 MCG tablet Take 1,000 mcg by mouth daily.   Past Week at Unknown time  . Cholecalciferol (VITAMIN D3 PO) Take 1 capsule by mouth daily.   Past Week at Unknown time  . naproxen sodium (ANAPROX) 220 MG tablet Take 220 mg by mouth daily as needed (for pain or headache).    Past Week at Unknown time  . VIVELLE-DOT 0.0375 MG/24HR Apply 1 patch topically 2 (two) times a week.    08/18/2017 at Unknown time   Allergies  Allergen Reactions  . Penicillins Rash and Other (See Comments)    Severe rash as a child Has patient had a PCN reaction causing immediate rash, facial/tongue/throat swelling, SOB or lightheadedness with hypotension: No Has patient had a PCN reaction causing severe rash involving mucus membranes or skin necrosis: No Has patient had a PCN reaction that required  hospitalization: No Has patient had a PCN reaction occurring within the last 10 years: No If all of the above answers are "NO", then may proceed with Cephalosporin use.     Social History   Tobacco Use  . Smoking status: Never Smoker  . Smokeless tobacco: Never Used  Substance Use Topics  . Alcohol use: Yes    Comment: rare    History reviewed. No pertinent family history.   Objective:   Patient Vitals for the past 8 hrs:  BP Temp Temp src Resp SpO2 Height Weight  08/18/17 1059 (!) 112/52 97.7 F (36.5 C) Oral 11 98 % 5\' 6"  (1.676 m) 74.8 kg (165 lb)   No intake/output data recorded. No intake/output data recorded.   See MD Preop evaluation      Assessment:   1. Rectal polyps status post APC fulgaration 6 months ago  Plan:   Will proceed with sigmoidoscopy with fulgaration with APC if needed. Have discussed with the patient and husband.

## 2017-08-18 NOTE — Discharge Instructions (Signed)
Repeat colonoscopy in 2 years

## 2019-03-29 ENCOUNTER — Other Ambulatory Visit: Payer: Self-pay | Admitting: Gastroenterology

## 2019-04-09 ENCOUNTER — Other Ambulatory Visit (HOSPITAL_COMMUNITY): Payer: BC Managed Care – PPO

## 2019-04-11 ENCOUNTER — Other Ambulatory Visit (HOSPITAL_COMMUNITY)
Admission: RE | Admit: 2019-04-11 | Discharge: 2019-04-11 | Disposition: A | Discharge status: Home / Self Care | Payer: BC Managed Care – PPO | Source: Ambulatory Visit | Attending: Gastroenterology | Admitting: Gastroenterology

## 2019-04-11 DIAGNOSIS — Z20828 Contact with and (suspected) exposure to other viral communicable diseases: Secondary | ICD-10-CM | POA: Diagnosis not present

## 2019-04-11 DIAGNOSIS — Z01812 Encounter for preprocedural laboratory examination: Secondary | ICD-10-CM | POA: Diagnosis not present

## 2019-04-12 ENCOUNTER — Other Ambulatory Visit: Payer: Self-pay

## 2019-04-12 ENCOUNTER — Encounter (HOSPITAL_COMMUNITY): Payer: Self-pay | Admitting: *Deleted

## 2019-04-13 LAB — NOVEL CORONAVIRUS, NAA (HOSP ORDER, SEND-OUT TO REF LAB; TAT 18-24 HRS): SARS-CoV-2, NAA: NOT DETECTED

## 2019-04-14 ENCOUNTER — Encounter (HOSPITAL_COMMUNITY): Payer: Self-pay | Admitting: Anesthesiology

## 2019-04-14 NOTE — Anesthesia Preprocedure Evaluation (Deleted)
Anesthesia Evaluation    Reviewed: Allergy & Precautions, Patient's Chart, lab work & pertinent test results  History of Anesthesia Complications (+) PONV  Airway        Dental   Pulmonary neg pulmonary ROS,           Cardiovascular Exercise Tolerance: Good negative cardio ROS       Neuro/Psych negative neurological ROS  negative psych ROS   GI/Hepatic negative GI ROS, Neg liver ROS,   Endo/Other  negative endocrine ROS  Renal/GU negative Renal ROS  negative genitourinary   Musculoskeletal negative musculoskeletal ROS (+)   Abdominal   Peds  Hematology negative hematology ROS (+)   Anesthesia Other Findings   Reproductive/Obstetrics                             Anesthesia Physical Anesthesia Plan  ASA: II  Anesthesia Plan: MAC   Post-op Pain Management:    Induction:   PONV Risk Score and Plan:   Airway Management Planned: Natural Airway and Nasal Cannula  Additional Equipment:   Intra-op Plan:   Post-operative Plan:   Informed Consent:   Plan Discussed with:   Anesthesia Plan Comments: (Hx of rectal polyp excision 6 month follow up colonoscopy)        Anesthesia Quick Evaluation

## 2019-04-15 ENCOUNTER — Ambulatory Visit (HOSPITAL_COMMUNITY)
Admission: RE | Admit: 2019-04-15 | Discharge: 2019-04-15 | Disposition: A | Discharge status: Home / Self Care | Payer: BC Managed Care – PPO | Attending: Gastroenterology | Admitting: Gastroenterology

## 2019-04-15 ENCOUNTER — Encounter (HOSPITAL_COMMUNITY): Payer: Self-pay | Admitting: Emergency Medicine

## 2019-04-15 ENCOUNTER — Other Ambulatory Visit: Payer: Self-pay

## 2019-04-15 ENCOUNTER — Encounter (HOSPITAL_COMMUNITY)
Admission: RE | Disposition: A | Discharge status: Home / Self Care | Payer: Self-pay | Source: Home / Self Care | Attending: Gastroenterology

## 2019-04-15 HISTORY — DX: Other specified postprocedural states: Z98.890

## 2019-04-15 HISTORY — DX: Nausea with vomiting, unspecified: R11.2

## 2019-04-15 SURGERY — CANCELLED PROCEDURE
Anesthesia: Monitor Anesthesia Care

## 2019-04-15 MED ORDER — PROPOFOL 10 MG/ML IV BOLUS
INTRAVENOUS | Status: AC
  Filled 2019-04-15: qty 40

## 2019-04-15 SURGICAL SUPPLY — 22 items

## 2019-04-15 NOTE — Progress Notes (Signed)
Pt stated she could only drink less than 1/2 of prep and had only one formed bowel movement at 0545 and it was brown and formed. Dr. Oletta Lamas made aware. Plan to reschedule for tomorrow at 1115 . Pt will get new prep today. She will quarantine the rest of the day til tomorrow and will stay on clear liquids and drink new prep. Pt will be back at hospital at [place] tomorrow.

## 2019-04-15 NOTE — Progress Notes (Signed)
Pre-op endo call comepleted Patient explains she was supposed to have colonoscopy today but was cancelled because she did not finish the pre-op . She states that she has been drinking the new prep they gave her today but has consumed less than half the prep as she states " its very hard to do this but Dr Osborne Oman called me and said that I just need to drink as much of it as I can since I already drank some yesterday" . Patient also states that she has not had a bowel movement since 5am this morning. RN advised patient to call endoscopy early tomorrow morning if she does not have a bowel movement between now and tomorrow morning.

## 2019-04-16 ENCOUNTER — Ambulatory Visit (HOSPITAL_COMMUNITY): Payer: BC Managed Care – PPO | Admitting: Anesthesiology

## 2019-04-16 ENCOUNTER — Encounter (HOSPITAL_COMMUNITY): Payer: Self-pay

## 2019-04-16 ENCOUNTER — Other Ambulatory Visit: Payer: Self-pay

## 2019-04-16 ENCOUNTER — Ambulatory Visit (HOSPITAL_COMMUNITY)
Admission: RE | Admit: 2019-04-16 | Discharge: 2019-04-16 | Disposition: A | Discharge status: Home / Self Care | Payer: BC Managed Care – PPO | Attending: Gastroenterology | Admitting: Gastroenterology

## 2019-04-16 ENCOUNTER — Ambulatory Visit (HOSPITAL_COMMUNITY): Admit: 2019-04-16 | Payer: BC Managed Care – PPO | Admitting: Gastroenterology

## 2019-04-16 ENCOUNTER — Encounter (HOSPITAL_COMMUNITY)
Admission: RE | Disposition: A | Discharge status: Home / Self Care | Payer: Self-pay | Source: Home / Self Care | Attending: Gastroenterology

## 2019-04-16 DIAGNOSIS — Z79899 Other long term (current) drug therapy: Secondary | ICD-10-CM | POA: Insufficient documentation

## 2019-04-16 DIAGNOSIS — Z86010 Personal history of colonic polyps: Secondary | ICD-10-CM | POA: Diagnosis present

## 2019-04-16 DIAGNOSIS — Z880 Allergy status to penicillin: Secondary | ICD-10-CM | POA: Insufficient documentation

## 2019-04-16 DIAGNOSIS — Z1211 Encounter for screening for malignant neoplasm of colon: Secondary | ICD-10-CM | POA: Insufficient documentation

## 2019-04-16 DIAGNOSIS — K5140 Inflammatory polyps of colon without complications: Secondary | ICD-10-CM | POA: Diagnosis not present

## 2019-04-16 DIAGNOSIS — K640 First degree hemorrhoids: Secondary | ICD-10-CM | POA: Diagnosis not present

## 2019-04-16 DIAGNOSIS — K5730 Diverticulosis of large intestine without perforation or abscess without bleeding: Secondary | ICD-10-CM | POA: Insufficient documentation

## 2019-04-16 DIAGNOSIS — K621 Rectal polyp: Secondary | ICD-10-CM | POA: Insufficient documentation

## 2019-04-16 HISTORY — PX: POLYPECTOMY: SHX5525

## 2019-04-16 HISTORY — PX: COLONOSCOPY WITH PROPOFOL: SHX5780

## 2019-04-16 SURGERY — COLONOSCOPY WITH PROPOFOL
Anesthesia: Monitor Anesthesia Care

## 2019-04-16 SURGERY — COLONOSCOPY
Anesthesia: Monitor Anesthesia Care

## 2019-04-16 MED ORDER — PROPOFOL 500 MG/50ML IV EMUL
INTRAVENOUS | Status: DC | PRN
  Administered 2019-04-16: 110 ug/kg/min via INTRAVENOUS

## 2019-04-16 MED ORDER — ONDANSETRON HCL 4 MG/2ML IJ SOLN
INTRAMUSCULAR | Status: DC | PRN
  Administered 2019-04-16: 4 mg via INTRAVENOUS

## 2019-04-16 MED ORDER — SODIUM CHLORIDE 0.9 % IV SOLN: INTRAVENOUS | Status: DC

## 2019-04-16 MED ORDER — PROPOFOL 10 MG/ML IV BOLUS
INTRAVENOUS | Status: DC | PRN
  Administered 2019-04-16: 20 mg via INTRAVENOUS
  Administered 2019-04-16: 10 mg via INTRAVENOUS
  Administered 2019-04-16 (×3): 20 mg via INTRAVENOUS
  Administered 2019-04-16: 40 mg via INTRAVENOUS
  Administered 2019-04-16: 20 mg via INTRAVENOUS

## 2019-04-16 MED ORDER — PROPOFOL 10 MG/ML IV BOLUS
INTRAVENOUS | Status: AC
  Filled 2019-04-16: qty 20

## 2019-04-16 MED ORDER — PROPOFOL 10 MG/ML IV BOLUS
INTRAVENOUS | Status: AC
  Filled 2019-04-16: qty 40

## 2019-04-16 MED ORDER — LACTATED RINGERS IV SOLN
INTRAVENOUS | Status: DC
  Administered 2019-04-16: 13:00:00 via INTRAVENOUS

## 2019-04-16 SURGICAL SUPPLY — 22 items

## 2019-04-16 NOTE — Anesthesia Preprocedure Evaluation (Signed)
Anesthesia Evaluation    Reviewed: Allergy & Precautions, Patient's Chart, lab work & pertinent test results  History of Anesthesia Complications (+) PONV  Airway Mallampati: II  TM Distance: >3 FB Neck ROM: Full    Dental no notable dental hx.    Pulmonary neg pulmonary ROS,    Pulmonary exam normal breath sounds clear to auscultation       Cardiovascular Exercise Tolerance: Good negative cardio ROS Normal cardiovascular exam Rhythm:Regular Rate:Normal     Neuro/Psych negative neurological ROS  negative psych ROS   GI/Hepatic negative GI ROS, Neg liver ROS,   Endo/Other  negative endocrine ROS  Renal/GU negative Renal ROS  negative genitourinary   Musculoskeletal negative musculoskeletal ROS (+)   Abdominal   Peds  Hematology negative hematology ROS (+)   Anesthesia Other Findings   Reproductive/Obstetrics                             Anesthesia Physical  Anesthesia Plan  ASA: II  Anesthesia Plan: MAC   Post-op Pain Management:    Induction:   PONV Risk Score and Plan: 3 and Ondansetron, Dexamethasone, Midazolam and Treatment may vary due to age or medical condition  Airway Management Planned: Natural Airway and Nasal Cannula  Additional Equipment:   Intra-op Plan:   Post-operative Plan:   Informed Consent:   Plan Discussed with:   Anesthesia Plan Comments: (Hx of rectal polyp excision 6 month follow up colonoscopy)        Anesthesia Quick Evaluation

## 2019-04-16 NOTE — Transfer of Care (Signed)
Immediate Anesthesia Transfer of Care Note  Patient: Rachel Mccann  Procedure(s) Performed: Procedure(s): COLONOSCOPY WITH PROPOFOL (N/A) POLYPECTOMY  Patient Location: PACU  Anesthesia Type:MAC  Level of Consciousness:  sedated, patient cooperative and responds to stimulation  Airway & Oxygen Therapy:Patient Spontanous Breathing and Patient connected to face mask oxgen  Post-op Assessment:  Report given to PACU RN and Post -op Vital signs reviewed and stable  Post vital signs:  Reviewed and stable  Last Vitals:  Vitals:   04/16/19 1108  BP: (!) 107/58  Pulse: 78  Resp: 13  Temp: 36.5 C  SpO2: 762%    Complications: No apparent anesthesia complications

## 2019-04-16 NOTE — Discharge Instructions (Signed)
YOU HAD AN ENDOSCOPIC PROCEDURE TODAY: Refer to the procedure report and other information in the discharge instructions given to you for any specific questions about what was found during the examination. If this information does not answer your questions, please call [facility] office at [phone] to clarify.  ° °YOU SHOULD EXPECT: Some feelings of bloating in the abdomen. Passage of more gas than usual. Walking can help get rid of the air that was put into your GI tract during the procedure and reduce the bloating. If you had a lower endoscopy (such as a colonoscopy or flexible sigmoidoscopy) you may notice spotting of blood in your stool or on the toilet paper. Some abdominal soreness may be present for a day or two, also. ° °DIET: Your first meal following the procedure should be a light meal and then it is ok to progress to your normal diet. A half-sandwich or bowl of soup is an example of a good first meal. Heavy or fried foods are harder to digest and may make you feel nauseous or bloated. Drink plenty of fluids but you should avoid alcoholic beverages for 24 hours. If you had a esophageal dilation, please see attached instructions for diet.  ° °ACTIVITY: Your care partner should take you home directly after the procedure. You should plan to take it easy, moving slowly for the rest of the day. You can resume normal activity the day after the procedure however YOU SHOULD NOT DRIVE, use power tools, machinery or perform tasks that involve climbing or major physical exertion for 24 hours (because of the sedation medicines used during the test).  ° °SYMPTOMS TO REPORT IMMEDIATELY: °A gastroenterologist can be reached at any hour. Please call [phone]  for any of the following symptoms:  °• Following lower endoscopy (colonoscopy, flexible sigmoidoscopy) °Excessive amounts of blood in the stool  °Significant tenderness, worsening of abdominal pains  °Swelling of the abdomen that is new, acute  °Fever of 100°  or higher  °FOLLOW UP:  °If any biopsies were taken you will be contacted by phone or by letter within the next 1-3 weeks. Call [phone]  if you have not heard about the biopsies in 3 weeks.  °Please also call with any specific questions about appointments or follow up tests. °No aspirin, ibuprofen or other NSAID medications for 5 days. °Office will send note or call when pathology results are obtained. °Colonoscopy will be repeated based on the pathology results. °

## 2019-04-16 NOTE — Op Note (Signed)
[facility] Patient Name: Rachel Mccann Procedure Date: 04/16/2019 MRN: [id number] Attending MD: Tresea Mall Dr., MD Date of Birth: Feb 09, 1956 CSN: [id number] Age: 63 Admit Type: Inpatient Procedure:                Colonoscopy Indications:              High risk colon cancer surveillance: Personal                            history of colonic polyps, patient had a large                            rectal serrated adenoma removed 5/18 with                            sigmoidoscopy done several months later continued                            polyp was removed. Sigmoidoscopy repeated a couple                            months later with APC on standby no further                            residual polyp. Providers:                Llana Aliment. Sheriece Jefcoat Dr., MD, Dwain Sarna, RN, Lanna Poche, Technician, Paris Lore CRNA, CRNA Referring MD:             Lupe Carney, MD Medicines:                Monitored Anesthesia Care Complications:            No immediate complications. Estimated Blood Loss:     Estimated blood loss: none. Procedure:                Pre-Anesthesia Assessment:                           - Prior to the procedure, a History and Physical                            was performed, and patient medications and                            allergies were reviewed. The patient's tolerance of                            previous anesthesia was also reviewed. The risks                            and benefits of the procedure and the sedation  options and risks were discussed with the patient.                            All questions were answered, and informed consent                            was obtained. Prior Anticoagulants: The patient has                            taken no previous anticoagulant or antiplatelet                            agents. ASA Grade Assessment: II - A patient with                  mild systemic disease. After reviewing the risks                            and benefits, the patient was deemed in                            satisfactory condition to undergo the procedure.                           After obtaining informed consent, the colonoscope                            was passed under direct vision. Throughout the                            procedure, the patient's blood pressure, pulse, and                            oxygen saturations were monitored continuously. The                            [id number] ([id number] Olympus pediatric colonscope                            was introduced through the anus and advanced to the                            the cecum, identified by appendiceal orifice and                            ileocecal valve. The colonoscopy was performed                            without difficulty. The patient tolerated the                            procedure well. The quality of the bowel                            preparation was  fair. The ileocecal valve,                            appendiceal orifice, and rectum were photographed. Scope In: 12:52:13 PM Scope Out: 1:38:20 PM Scope Withdrawal Time: 0 hours 32 minutes 36 seconds  Total Procedure Duration: 0 hours 46 minutes 7 seconds  Findings:      The perianal and digital rectal examinations were normal.      A 3 mm polyp was found in the proximal ascending colon. The polyp was       sessile. The polyp was removed with a cold snare. Resection and       retrieval were complete. The pathology specimen was placed into Bottle       Number 1.      A 3 mm polyp was found in the rectum. The polyp was sessile. The polyp       was removed with a cold snare. Resection and retrieval were complete.       The pathology specimen was placed into Bottle Number 2.      Multiple medium-mouthed diverticula were found in the sigmoid colon and       descending colon.      Non-bleeding internal  hemorrhoids were found during retroflexion. The       hemorrhoids were large and Grade I (internal hemorrhoids that do not       prolapse). Impression:               - Preparation of the colon was fair.                           - One 3 mm polyp in the proximal ascending colon,                            removed with a cold snare. Resected and retrieved.                           - One 3 mm polyp in the rectum, removed with a cold                            snare. Resected and retrieved.                           - Diverticulosis in the sigmoid colon and in the                            descending colon.                           - Non-bleeding internal hemorrhoids.                           - Personal history of colonic polyps. Moderate Sedation:      See anesthesia note, no moderate sedation. Recommendation:           - Patient has a contact number available for                            emergencies.  The signs and symptoms of potential                            delayed complications were discussed with the                            patient. Return to normal activities tomorrow.                            Written discharge instructions were provided to the                            patient.                           - Resume previous diet.                           - Continue present medications.                           - No aspirin, ibuprofen, naproxen, or other                            non-steroidal anti-inflammatory drugs for 5 days                            after polyp removal.                           - Repeat colonoscopy in 2 years for surveillance                            based on pathology results. Procedure Code(s):        --- Professional ---                           [phone] [id number], Colonoscopy, flexible; with removal of                            tumor(s), polyp(s), or other lesion(s) by snare                            technique Diagnosis Code(s):        ---  Professional ---                           K62.1, Rectal polyp                           K63.5, Polyp of colon                           K57.30, Diverticulosis of large intestine without                            perforation or abscess without bleeding  Z86.010, Personal history of colonic polyps CPT copyright 2019 [employer]. All rights reserved. The codes documented in this report are preliminary and upon coder review may  be revised to meet current compliance requirements. Nancy Fetter Dr., MD 04/16/2019 1:49:45 PM This report has been signed electronically. Number of Addenda: 0

## 2019-04-16 NOTE — H&P (Signed)
Subjective:   Patient is a 63 y.o. female presents with history of previous rectal adenoma that was removed was a serrated adenoma there was slight residual adenoma subsequent sigmoidoscopy with large internal hemorrhoids.  64-month follow-up revealed with a rectal polyp was completely resolved.  She did have hemorrhoids.  This is done was a 2-year follow-up to that sigmoidoscopy.. Procedure including risks and benefits discussed in office.  There are no active problems to display for this patient.  Past Medical History:  Diagnosis Date  . PONV (postoperative nausea and vomiting)     Past Surgical History:  Procedure Laterality Date  . ABDOMINAL HYSTERECTOMY    . DENTAL SURGERY    . FLEXIBLE SIGMOIDOSCOPY N/A 03/17/2017   Procedure: FLEXIBLE SIGMOIDOSCOPY;  Surgeon: Laurence Spates, MD;  Location: [facility];  Service: Endoscopy;  Laterality: N/A;  . FLEXIBLE SIGMOIDOSCOPY N/A 08/18/2017   Procedure: FLEXIBLE SIGMOIDOSCOPY;  Surgeon: Laurence Spates, MD;  Location: [facility];  Service: Endoscopy;  Laterality: N/A;  . FOOT SURGERY Right 2010  . HOT HEMOSTASIS N/A 03/17/2017   Procedure: HOT HEMOSTASIS (ARGON PLASMA COAGULATION/BICAP);  Surgeon: Laurence Spates, MD;  Location: [facility];  Service: Endoscopy;  Laterality: N/A;  . KNEE SURGERY Right 2005    Medications Prior to Admission  Medication Sig Dispense Refill Last Dose  . Cholecalciferol (VITAMIN D3 PO) Take 1 capsule by mouth daily.   Past Week at Unknown time  . Multiple Vitamins-Minerals (MULTIVITAMIN WITH MINERALS) tablet Take 1 tablet by mouth daily.   Past Week at Unknown time  . naproxen sodium (ANAPROX) 220 MG tablet Take 220 mg by mouth daily as needed (for pain or headache).    Past Week at Unknown time  . VIVELLE-DOT 0.0375 MG/24HR Apply 1 patch topically 2 (two) times a week.    04/16/2019 at Unknown time   Allergies  Allergen Reactions  . Penicillins Rash and Other (See Comments)    Severe rash as a child Has  patient had a PCN reaction causing immediate rash, facial/tongue/throat swelling, SOB or lightheadedness with hypotension: No Has patient had a PCN reaction causing severe rash involving mucus membranes or skin necrosis: No Has patient had a PCN reaction that required hospitalization: No Has patient had a PCN reaction occurring within the last 10 years: No If all of the above answers are "NO", then may proceed with Cephalosporin use.     Social History   Tobacco Use  . Smoking status: Never Smoker  . Smokeless tobacco: Never Used  Substance Use Topics  . Alcohol use: Yes    Comment: rare    History reviewed. No pertinent family history.   Objective:   Patient Vitals for the past 8 hrs:  BP Temp Temp src Pulse Resp SpO2 Height Weight  04/16/19 1108 (!) 107/58 97.7 F (36.5 C) Oral 78 13 100 % 5\' 6"  (1.676 m) 74.8 kg   No intake/output data recorded. No intake/output data recorded.   See MD Preop evaluation      Assessment:   1.  History of large serrated adenoma of the rectum.  This is done as her surveillance colonoscopy  Plan:   We will proceed with surveillance colonoscopy with APC on standby if needed for any residual rectal polyp.

## 2019-04-17 ENCOUNTER — Encounter (HOSPITAL_COMMUNITY): Payer: Self-pay | Admitting: Gastroenterology

## 2019-04-17 LAB — SURGICAL PATHOLOGY

## 2019-04-17 NOTE — Anesthesia Postprocedure Evaluation (Signed)
Anesthesia Post Note  Patient: Rachel Mccann  Procedure(s) Performed: COLONOSCOPY WITH PROPOFOL (N/A ) POLYPECTOMY     Patient location during evaluation: Endoscopy Anesthesia Type: MAC Level of consciousness: awake and alert Pain management: pain level controlled Vital Signs Assessment: post-procedure vital signs reviewed and stable Respiratory status: spontaneous breathing, nonlabored ventilation and respiratory function stable Cardiovascular status: stable and blood pressure returned to baseline Postop Assessment: no apparent nausea or vomiting Anesthetic complications: no    Last Vitals:  Vitals:   04/16/19 1350 04/16/19 1400  BP: 124/64 110/64  Pulse: 72 74  Resp: (!) 23 15  Temp:    SpO2: 100% 100%    Last Pain:  Vitals:   04/16/19 1400  TempSrc:   PainSc: 0-No pain                 Lynda Rainwater

## 2020-03-17 NOTE — Progress Notes (Signed)
Office Visit Note  Patient: Rachel Mccann             Date of Birth: [birth year]           MRN: [id number]             PCP: Marcelle Overlie, MD Referring: Marcelle Overlie, MD Visit Date: 03/18/2020 Occupation: @GUAROCC @  Subjective:  Pain of the Right Shoulder; Pain of the Left Shoulder; Pain, Other, and Numbness of the Neck; and Numbness (bilateral hands)   History of Present Illness: [name] is a 64 y.o. female seen in consultation per request of her PCP.  According to the patient for the last 2 years she has been experiencing sharp pain across her shoulders which she described from her neck to the shoulder region.  She said the symptoms will come off and on.  She has been also experiencing some arthritis in her left thumb for the last several years.  She had osteoarthritis in her knee joints for the last 15 years.  She has been followed by Dr. 71.  She initially had cortisone injections and recently she has been receiving Visco supplement injections and had very good response to it.  She states she had a stiffness in her cervical spine several years ago and was seen by Dr. Constance Goltz who did MRI of her cervical spine and diagnosed her with degenerative disc disease.  No intervention was needed.  She does not recall going for physical therapy.  She states recently she has been experiencing numbness in her bilateral hands especially when she lays down on her back.  States last night she woke up with severe pain in her right hand which she describes over the medial aspect of her hand.  She states she had to take ibuprofen for that.  No family history of autoimmune disease or osteoarthritis.G4,P3,M1.  Activities of Daily Living:  Patient reports morning stiffness for 5 minutes.   Patient Denies nocturnal pain.  Difficulty dressing/grooming: Reports Difficulty climbing stairs: Denies Difficulty getting out of chair: Denies Difficulty using hands for taps, buttons, cutlery, and/or  writing: Reports  Review of Systems  Constitutional: Positive for fatigue.  HENT: Positive for nose dryness. Negative for mouth sores and mouth dryness.   Eyes: Negative for pain, itching, visual disturbance and dryness.  Respiratory: Negative for cough, hemoptysis, shortness of breath and difficulty breathing.   Cardiovascular: Negative for chest pain, palpitations and swelling in legs/feet.  Gastrointestinal: Positive for constipation. Negative for blood in stool and diarrhea.  Endocrine: Negative for increased urination.  Genitourinary: Negative for difficulty urinating and painful urination.  Musculoskeletal: Positive for arthralgias, joint pain, myalgias, morning stiffness, muscle tenderness and myalgias. Negative for joint swelling and muscle weakness.  Skin: Negative for color change, rash and redness.  Allergic/Immunologic: Positive for susceptible to infections.  Neurological: Positive for numbness. Negative for dizziness, headaches and memory loss.  Hematological: Positive for bruising/bleeding tendency. Negative for swollen glands.  Psychiatric/Behavioral: Negative for depressed mood, confusion and sleep disturbance. The patient is not nervous/anxious.     PMFS History:  There are no problems to display for this patient.   Past Medical History:  Diagnosis Date  . PONV (postoperative nausea and vomiting)     Family History  Problem Relation Age of Onset  . Lung cancer Father   . Diabetes Sister   . Healthy Daughter   . Healthy Son   . Healthy Son    Past Surgical History:  Procedure Laterality Date  .  ABDOMINAL HYSTERECTOMY    . CESAREAN SECTION  1985, 1987   per patient  . COLONOSCOPY WITH PROPOFOL N/A 04/16/2019   Procedure: COLONOSCOPY WITH PROPOFOL;  Surgeon: Carman Ching, MD;  Location: [facility];  Service: Endoscopy;  Laterality: N/A;  . DENTAL SURGERY    . FLEXIBLE SIGMOIDOSCOPY N/A 03/17/2017   Procedure: FLEXIBLE SIGMOIDOSCOPY;  Surgeon: Carman Ching, MD;  Location: [facility];  Service: Endoscopy;  Laterality: N/A;  . FLEXIBLE SIGMOIDOSCOPY N/A 08/18/2017   Procedure: FLEXIBLE SIGMOIDOSCOPY;  Surgeon: Carman Ching, MD;  Location: [facility];  Service: Endoscopy;  Laterality: N/A;  . FOOT SURGERY Right 2010  . HOT HEMOSTASIS N/A 03/17/2017   Procedure: HOT HEMOSTASIS (ARGON PLASMA COAGULATION/BICAP);  Surgeon: Carman Ching, MD;  Location: [facility];  Service: Endoscopy;  Laterality: N/A;  . KNEE SURGERY Right 2005  . POLYPECTOMY  04/16/2019   Procedure: POLYPECTOMY;  Surgeon: Carman Ching, MD;  Location: [facility];  Service: Endoscopy;;   Social History   Social History Narrative  . Not on file   Immunization History  Administered Date(s) Administered  . PFIZER SARS-COV-2 Vaccination 07/16/2019, 08/06/2019     Objective: Vital Signs: BP 110/69 (BP Location: Right Arm, Patient Position: Sitting, Cuff Size: Small)   Pulse 71   Resp 14   Ht 5\' 6"  (1.676 m)   Wt 176 lb 3.2 oz (79.9 kg)   BMI 28.44 kg/m    Physical Exam Vitals and nursing note reviewed.  Constitutional:      Appearance: She is well-developed.  HENT:     Head: Normocephalic and atraumatic.  Eyes:     Conjunctiva/sclera: Conjunctivae normal.  Cardiovascular:     Rate and Rhythm: Normal rate and regular rhythm.     Heart sounds: Normal heart sounds.  Pulmonary:     Effort: Pulmonary effort is normal.     Breath sounds: Normal breath sounds.  Abdominal:     General: Bowel sounds are normal.     Palpations: Abdomen is soft.  Musculoskeletal:     Cervical back: Normal range of motion.  Lymphadenopathy:     Cervical: No cervical adenopathy.  Skin:    General: Skin is warm and dry.     Capillary Refill: Capillary refill takes less than 2 seconds.  Neurological:     Mental Status: She is alert and oriented to person, place, and time.  Psychiatric:        Behavior: Behavior normal.      Musculoskeletal Exam: She has good range of  motion of her cervical spine without radiculopathy.  She had no tenderness over trapezius area.  Bilateral shoulder joints with good range of motion.  Elbow joints and wrist joints were in good range of motion.  She has bilateral CMC PIP and DIP thickening consistent with osteoarthritis without synovitis.  Hip joints and knee joints with good range of motion.  She has bilateral hallux rigidus.  She has bilateral pes cavus.  CDAI Exam: CDAI Score: -- Patient Global: --; Provider Global: -- Swollen: --; Tender: -- Joint Exam 03/18/2020   No joint exam has been documented for this visit   There is currently no information documented on the homunculus. Go to the Rheumatology activity and complete the homunculus joint exam.  Investigation: No additional findings.  Imaging: XR Cervical Spine 2 or 3 views  Result Date: 03/18/2020 Multilevel spondylosis was noted.  Narrowing was noted between C3-C4, C5-C6.  Facet joint arthropathy was noted. Impression: These findings are consistent with multilevel spondylosis  and facet joint arthropathy.  XR Hand 2 View Left  Result Date: 03/18/2020 Severe CMC narrowing was noted.  PIP and DIP narrowing was noted.  No MCP, intercarpal radiocarpal joint space narrowing was noted.  No erosive changes were noted. Impression: These findings are consistent with osteoarthritis of the hand.  XR Hand 2 View Right  Result Date: 03/18/2020 CMC, PIP and DIP narrowing was noted.  No MCP, intercarpal or radiocarpal joint space narrowing was noted. Impression: These findings are consistent with osteoarthritis of the hand.   Recent Labs: No results found for: WBC, HGB, PLT, NA, K, CL, CO2, GLUCOSE, BUN, CREATININE, BILITOT, ALKPHOS, AST, ALT, PROT, ALBUMIN, CALCIUM, GFRAA, QFTBGOLD, QFTBGOLDPLUS  Speciality Comments: No specialty comments available.  Procedures:  No procedures performed Allergies: Penicillins   Assessment / Plan:     Visit Diagnoses: Generalized  body aches - Pains in arms and legs for the past 4-5 months, refractory to tylenol use for 6 wks  Neck pain -patient states she has been experiencing neck pain for several years.  She had MRI by Dr. Newell Coral in the past and was told that she has degenerative changes.  She has been experiencing pain in her trapezius region bilaterally off and on which could be severe.  Plan: XR Cervical Spine 2 or 3 views.  X-ray showed multilevel spondylosis and facet joint arthropathy.  I offered physical therapy.  Patient states that she will go to her chiropractor's office as they have physical therapy there.  Pain in both hands -she complains of pain and discomfort in her bilateral hands.  She has bilateral CMC PIP and DIP thickening.  Plan: XR Hand 2 View Right, XR Hand 2 View Left, x-ray findings are consistent with osteoarthritis of bilateral hands.  X-ray findings were discussed.  A CMC brace was advised.  Sedimentation rate, Rheumatoid factor, Cyclic citrul peptide antibody, IgG, Uric acid.  A handout on hand exercises was given.  A list of natural supplements was given.  Paresthesia of both hands-she has been experiencing paresthesias in her bilateral hands at night she states usually occurs when she is sleeping on her back.  I will schedule nerve conduction velocities for evaluation.  Primary osteoarthritis of both knees -she has been followed by Dr. Charlann Boxer.  She states she has been diagnosed with osteoarthritis in her knee joints for several years.  She initially had cortisone injection and then series of visco gel injections recently.  She had good response to Visco supplement injections.  Primary osteoarthritis of both feet-she has discomfort in her bilateral feet.  Hallux rigidus was noted in her toes.  We also reviewed x-rays of her feet from 2017.  Pes cavus-she has bilateral pes cavus.  Shoes with good arch support and metatarsal support were discussed.  Osteopenia of multiple sites -she states she has  had DEXA by Dr. Vincente Poli.  Plan: VITAMIN D 25 Hydroxy (Vit-D Deficiency, Fractures)  Other fatigue -she has been experiencing some fatigue and muscle pain.  I will obtain following labs today.  Plan: CBC with Differential/Platelet, COMPLETE METABOLIC PANEL WITH GFR, CK  Orders: Orders Placed This Encounter  Procedures  . XR Hand 2 View Right  . XR Hand 2 View Left  . XR Cervical Spine 2 or 3 views  . CBC with Differential/Platelet  . COMPLETE METABOLIC PANEL WITH GFR  . Sedimentation rate  . Rheumatoid factor  . Cyclic citrul peptide antibody, IgG  . Uric acid  . VITAMIN D 25 Hydroxy (Vit-D Deficiency, Fractures)  .  CK  . Ambulatory referral to Neurology   No orders of the defined types were placed in this encounter.     Follow-Up Instructions: Return for Osteoarthritis.   Pollyann SavoyShaili Ramatoulaye Pack, MD  Note - This record has been created using Animal nutritionistDragon software.  Chart creation errors have been sought, but may not always  have been located. Such creation errors do not reflect on  the standard of medical care.

## 2020-03-18 ENCOUNTER — Ambulatory Visit: Payer: Self-pay

## 2020-03-18 ENCOUNTER — Other Ambulatory Visit: Payer: Self-pay

## 2020-03-18 ENCOUNTER — Ambulatory Visit (INDEPENDENT_AMBULATORY_CARE_PROVIDER_SITE_OTHER): Payer: BC Managed Care – PPO | Admitting: Rheumatology

## 2020-03-18 ENCOUNTER — Encounter: Payer: Self-pay | Admitting: Rheumatology

## 2020-03-18 VITALS — BP 110/69 | HR 71 | Resp 14 | Ht 66.0 in | Wt 176.2 lb

## 2020-03-18 DIAGNOSIS — M79642 Pain in left hand: Secondary | ICD-10-CM | POA: Diagnosis not present

## 2020-03-18 DIAGNOSIS — M79641 Pain in right hand: Secondary | ICD-10-CM | POA: Diagnosis not present

## 2020-03-18 DIAGNOSIS — R52 Pain, unspecified: Secondary | ICD-10-CM | POA: Diagnosis not present

## 2020-03-18 DIAGNOSIS — M170 Bilateral primary osteoarthritis of knee: Secondary | ICD-10-CM

## 2020-03-18 DIAGNOSIS — Q667 Congenital pes cavus, unspecified foot: Secondary | ICD-10-CM

## 2020-03-18 DIAGNOSIS — R5383 Other fatigue: Secondary | ICD-10-CM

## 2020-03-18 DIAGNOSIS — M8589 Other specified disorders of bone density and structure, multiple sites: Secondary | ICD-10-CM

## 2020-03-18 DIAGNOSIS — R202 Paresthesia of skin: Secondary | ICD-10-CM

## 2020-03-18 DIAGNOSIS — M542 Cervicalgia: Secondary | ICD-10-CM | POA: Diagnosis not present

## 2020-03-18 DIAGNOSIS — M19071 Primary osteoarthritis, right ankle and foot: Secondary | ICD-10-CM

## 2020-03-18 DIAGNOSIS — M19072 Primary osteoarthritis, left ankle and foot: Secondary | ICD-10-CM

## 2020-03-18 NOTE — Patient Instructions (Addendum)
Hand Exercises Hand exercises can be helpful for almost anyone. These exercises can strengthen the hands, improve flexibility and movement, and increase blood flow to the hands. These results can make work and daily tasks easier. Hand exercises can be especially helpful for people who have joint pain from arthritis or have nerve damage from overuse (carpal tunnel syndrome). These exercises can also help people who have injured a hand. Exercises Most of these hand exercises are gentle stretching and motion exercises. It is usually safe to do them often throughout the day. Warming up your hands before exercise may help to reduce stiffness. You can do this with gentle massage or by placing your hands in warm water for 10-15 minutes. It is normal to feel some stretching, pulling, tightness, or mild discomfort as you begin new exercises. This will gradually improve. Stop an exercise right away if you feel sudden, severe pain or your pain gets worse. Ask your health care provider which exercises are best for you. Knuckle bend or "claw" fist Cervical Strain and Sprain Rehab Ask your health care provider which exercises are safe for you. Do exercises exactly as told by your health care provider and adjust them as directed. It is normal to feel mild stretching, pulling, tightness, or discomfort as you do these exercises. Stop right away if you feel sudden pain or your pain gets worse. Do not begin these exercises until told by your health care provider. Stretching and range-of-motion exercises Cervical side bending  1. Using good posture, sit on a stable chair or stand up. 2. Without moving your shoulders, slowly tilt your left / right ear to your shoulder until you feel a stretch in the opposite side neck muscles. You should be looking straight ahead. 3. Hold for __________ seconds. 4. Repeat with the other side of your neck. Repeat __________ times. Complete this exercise __________ times a day. Cervical  rotation  1. Using good posture, sit on a stable chair or stand up. 2. Slowly turn your head to the side as if you are looking over your left / right shoulder. ? Keep your eyes level with the ground. ? Stop when you feel a stretch along the side and the back of your neck. 3. Hold for __________ seconds. 4. Repeat this by turning to your other side. Repeat __________ times. Complete this exercise __________ times a day. Thoracic extension and pectoral stretch 1. Roll a towel or a small blanket so it is about 4 inches (10 cm) in diameter. 2. Lie down on your back on a firm surface. 3. Put the towel lengthwise, under your spine in the middle of your back. It should not be under your shoulder blades. The towel should line up with your spine from your middle back to your lower back. 4. Put your hands behind your head and let your elbows fall out to your sides. 5. Hold for __________ seconds. Repeat __________ times. Complete this exercise __________ times a day. Strengthening exercises Isometric upper cervical flexion 1. Lie on your back with a thin pillow behind your head and a small rolled-up towel under your neck. 2. Gently tuck your chin toward your chest and nod your head down to look toward your feet. Do not lift your head off the pillow. 3. Hold for __________ seconds. 4. Release the tension slowly. Relax your neck muscles completely before you repeat this exercise. Repeat __________ times. Complete this exercise __________ times a day. Isometric cervical extension  1. Stand about 6 inches (15  cm) away from a wall, with your back facing the wall. 2. Place a soft object, about 6-8 inches (15-20 cm) in diameter, between the back of your head and the wall. A soft object could be a small pillow, a ball, or a folded towel. 3. Gently tilt your head back and press into the soft object. Keep your jaw and forehead relaxed. 4. Hold for __________ seconds. 5. Release the tension slowly. Relax  your neck muscles completely before you repeat this exercise. Repeat __________ times. Complete this exercise __________ times a day. Posture and body mechanics Body mechanics refers to the movements and positions of your body while you do your daily activities. Posture is part of body mechanics. Good posture and healthy body mechanics can help to relieve stress in your body's tissues and joints. Good posture means that your spine is in its natural S-curve position (your spine is neutral), your shoulders are pulled back slightly, and your head is not tipped forward. The following are general guidelines for applying improved posture and body mechanics to your everyday activities. Sitting  1. When sitting, keep your spine neutral and keep your feet flat on the floor. Use a footrest, if necessary, and keep your thighs parallel to the floor. Avoid rounding your shoulders, and avoid tilting your head forward. 2. When working at a desk or a computer, keep your desk at a height where your hands are slightly lower than your elbows. Slide your chair under your desk so you are close enough to maintain good posture. 3. When working at a computer, place your monitor at a height where you are looking straight ahead and you do not have to tilt your head forward or downward to look at the screen. Standing   When standing, keep your spine neutral and keep your feet about hip-width apart. Keep a slight bend in your knees. Your ears, shoulders, and hips should line up.  When you do a task in which you stand in one place for a long time, place one foot up on a stable object that is 2-4 inches (5-10 cm) high, such as a footstool. This helps keep your spine neutral. Resting When lying down and resting, avoid positions that are most painful for you. Try to support your neck in a neutral position. You can use a contour pillow or a small rolled-up towel. Your pillow should support your neck but not push on it. This  information is not intended to replace advice given to you by your health care provider. Make sure you discuss any questions you have with your health care provider. Document Revised: 10/03/2018 Document Reviewed: 03/14/2018 Elsevier Patient Education  2020 [employer]  1. Stand or sit with your arm, hand, and all five fingers pointed straight up. Make sure to keep your wrist straight during the exercise. 2. Gently bend your fingers down toward your palm until the tips of your fingers are touching the top of your palm. Keep your big knuckle straight and just bend the small knuckles in your fingers. 3. Hold this position for __________ seconds. 4. Straighten (extend) your fingers back to the starting position. Repeat this exercise 5-10 times with each hand. Full finger fist 1. Stand or sit with your arm, hand, and all five fingers pointed straight up. Make sure to keep your wrist straight during the exercise. 2. Gently bend your fingers into your palm until the tips of your fingers are touching the middle of your palm. 3. Hold this position for  __________ seconds. 4. Extend your fingers back to the starting position, stretching every joint fully. Repeat this exercise 5-10 times with each hand. Straight fist 1. Stand or sit with your arm, hand, and all five fingers pointed straight up. Make sure to keep your wrist straight during the exercise. 2. Gently bend your fingers at the big knuckle, where your fingers meet your hand, and the middle knuckle. Keep the knuckle at the tips of your fingers straight and try to touch the bottom of your palm. 3. Hold this position for __________ seconds. 4. Extend your fingers back to the starting position, stretching every joint fully. Repeat this exercise 5-10 times with each hand. Tabletop 1. Stand or sit with your arm, hand, and all five fingers pointed straight up. Make sure to keep your wrist straight during the exercise. 2. Gently bend your fingers at  the big knuckle, where your fingers meet your hand, as far down as you can while keeping the small knuckles in your fingers straight. Think of forming a tabletop with your fingers. 3. Hold this position for __________ seconds. 4. Extend your fingers back to the starting position, stretching every joint fully. Repeat this exercise 5-10 times with each hand. Finger spread 1. Place your hand flat on a table with your palm facing down. Make sure your wrist stays straight as you do this exercise. 2. Spread your fingers and thumb apart from each other as far as you can until you feel a gentle stretch. Hold this position for __________ seconds. 3. Bring your fingers and thumb tight together again. Hold this position for __________ seconds. Repeat this exercise 5-10 times with each hand. Making circles 1. Stand or sit with your arm, hand, and all five fingers pointed straight up. Make sure to keep your wrist straight during the exercise. 2. Make a circle by touching the tip of your thumb to the tip of your index finger. 3. Hold for __________ seconds. Then open your hand wide. 4. Repeat this motion with your thumb and each finger on your hand. Repeat this exercise 5-10 times with each hand. Thumb motion 1. Sit with your forearm resting on a table and your wrist straight. Your thumb should be facing up toward the ceiling. Keep your fingers relaxed as you move your thumb. 2. Lift your thumb up as high as you can toward the ceiling. Hold for __________ seconds. 3. Bend your thumb across your palm as far as you can, reaching the tip of your thumb for the small finger (pinkie) side of your palm. Hold for __________ seconds. Repeat this exercise 5-10 times with each hand. Grip strengthening  1. Hold a stress ball or other soft ball in the middle of your hand. 2. Slowly increase the pressure, squeezing the ball as much as you can without causing pain. Think of bringing the tips of your fingers into the  middle of your palm. All of your finger joints should bend when doing this exercise. 3. Hold your squeeze for __________ seconds, then relax. Repeat this exercise 5-10 times with each hand. Contact a health care provider if:  Your hand pain or discomfort gets much worse when you do an exercise.  Your hand pain or discomfort does not improve within 2 hours after you exercise. If you have any of these problems, stop doing these exercises right away. Do not do them again unless your health care provider says that you can. Get help right away if:  You develop sudden, severe hand  pain or swelling. If this happens, stop doing these exercises right away. Do not do them again unless your health care provider says that you can. This information is not intended to replace advice given to you by your health care provider. Make sure you discuss any questions you have with your health care provider. Document Revised: 10/04/2018 Document Reviewed: 06/14/2018 Elsevier Patient Education  2020 [employer].

## 2020-03-19 LAB — CBC WITH DIFFERENTIAL/PLATELET
Absolute Monocytes: 479 cells/uL (ref 200–950)
Basophils Absolute: 57 cells/uL (ref 0–200)
Basophils Relative: 1 %
Eosinophils Absolute: 160 cells/uL (ref 15–500)
Eosinophils Relative: 2.8 %
HCT: 42.9 % (ref 35.0–45.0)
Hemoglobin: 14.1 g/dL (ref 11.7–15.5)
Lymphs Abs: 1340 cells/uL (ref 850–3900)
MCH: 30.3 pg (ref 27.0–33.0)
MCHC: 32.9 g/dL (ref 32.0–36.0)
MCV: 92.3 fL (ref 80.0–100.0)
MPV: 10.5 fL (ref 7.5–12.5)
Monocytes Relative: 8.4 %
Neutro Abs: 3665 cells/uL (ref 1500–7800)
Neutrophils Relative %: 64.3 %
Platelets: 266 10*3/uL (ref 140–400)
RBC: 4.65 10*6/uL (ref 3.80–5.10)
RDW: 12.5 % (ref 11.0–15.0)
Total Lymphocyte: 23.5 %
WBC: 5.7 10*3/uL (ref 3.8–10.8)

## 2020-03-19 LAB — COMPLETE METABOLIC PANEL WITH GFR
AG Ratio: 1.5 (calc) (ref 1.0–2.5)
ALT: 13 U/L (ref 6–29)
AST: 15 U/L (ref 10–35)
Albumin: 3.8 g/dL (ref 3.6–5.1)
Alkaline phosphatase (APISO): 75 U/L (ref 37–153)
BUN: 23 mg/dL (ref 7–25)
CO2: 27 mmol/L (ref 20–32)
Calcium: 9.4 mg/dL (ref 8.6–10.4)
Chloride: 105 mmol/L (ref 98–110)
Creat: 0.89 mg/dL (ref 0.50–0.99)
GFR, Est African American: 78 mL/min/{1.73_m2} (ref >=60)
GFR, Est Non African American: 68 mL/min/{1.73_m2} (ref >=60)
Globulin: 2.6 g/dL (calc) (ref 1.9–3.7)
Glucose, Bld: 85 mg/dL (ref 65–99)
Potassium: 4.6 mmol/L (ref 3.5–5.3)
Sodium: 140 mmol/L (ref 135–146)
Total Bilirubin: 0.2 mg/dL (ref 0.2–1.2)
Total Protein: 6.4 g/dL (ref 6.1–8.1)

## 2020-03-19 LAB — CK: Total CK: 43 U/L (ref 29–143)

## 2020-03-19 LAB — URIC ACID: Uric Acid, Serum: 3.7 mg/dL (ref 2.5–7.0)

## 2020-03-19 LAB — VITAMIN D 25 HYDROXY (VIT D DEFICIENCY, FRACTURES): Vit D, 25-Hydroxy: 51 ng/mL (ref 30–100)

## 2020-03-19 LAB — RHEUMATOID FACTOR: Rheumatoid fact SerPl-aCnc: <14 IU/mL (ref <14)

## 2020-03-19 LAB — SEDIMENTATION RATE: Sed Rate: 2 mm/h (ref 0–30)

## 2020-03-19 LAB — CYCLIC CITRUL PEPTIDE ANTIBODY, IGG: Cyclic Citrullin Peptide Ab: <16 UNITS

## 2020-03-19 NOTE — Progress Notes (Signed)
I will discuss results at the follow-up visit.

## 2020-04-03 DIAGNOSIS — M19041 Primary osteoarthritis, right hand: Secondary | ICD-10-CM | POA: Insufficient documentation

## 2020-04-03 DIAGNOSIS — M5030 Other cervical disc degeneration, unspecified cervical region: Secondary | ICD-10-CM | POA: Insufficient documentation

## 2020-04-03 NOTE — Progress Notes (Signed)
Office Visit Note  Patient: Rachel Mccann             Date of Birth: June 30, 1955           MRN: [id number]             PCP: Dian Queen, MD Referring: Dian Queen, MD Visit Date: 04/14/2020 Occupation: _0 @  Subjective:  Pain in multiple joints.   History of Present Illness: Rachel Mccann is a 64 y.o. female with history of osteoarthritis.  She states she continues to have some pain and discomfort in her cervical spine and her knee joints.  She states she has been doing neck exercises which has been helpful.  She had knee joint injections at Dr. Aurea Graff office.  She states she initially had cortisone injection and then viscosupplementation's.  Has been 8 months since she had discussed pleural injections.  She would like to switch getting was visible injections here due to convenience and travel time.  She has been having pain and stiffness in her hands which is manageable currently.  She states she been experiencing paresthesias in her hands.  She has nerve conduction velocity next week.  Activities of Daily Living:  Patient reports morning stiffness for 5 minutes.   Patient Reports nocturnal pain.  Difficulty dressing/grooming: Denies Difficulty climbing stairs: Reports Difficulty getting out of chair: Reports Difficulty using hands for taps, buttons, cutlery, and/or writing: Reports  Review of Systems  Constitutional: Negative for fatigue.  HENT: Positive for nose dryness. Negative for mouth sores and mouth dryness.   Eyes: Negative for pain, itching and dryness.  Respiratory: Negative for shortness of breath and difficulty breathing.   Cardiovascular: Negative for chest pain and palpitations.  Gastrointestinal: Positive for constipation. Negative for blood in stool and diarrhea.  Endocrine: Negative for increased urination.  Genitourinary: Negative for difficulty urinating.  Musculoskeletal: Positive for arthralgias, joint pain, myalgias, morning stiffness,  muscle tenderness and myalgias. Negative for joint swelling.  Skin: Negative for color change, rash and redness.  Allergic/Immunologic: Negative for susceptible to infections.  Neurological: Positive for numbness. Negative for dizziness, headaches, memory loss and weakness.  Hematological: Positive for bruising/bleeding tendency.  Psychiatric/Behavioral: Positive for sleep disturbance. Negative for confusion.    PMFS History:  Patient Active Problem List   Diagnosis Date Noted  . Primary osteoarthritis of both knees 04/14/2020  . Primary osteoarthritis of both feet 04/14/2020  . Pes cavus 04/14/2020  . Osteopenia of multiple sites 04/14/2020  . DDD (degenerative disc disease), cervical 04/03/2020  . Primary osteoarthritis of both hands 04/03/2020    Past Medical History:  Diagnosis Date  . PONV (postoperative nausea and vomiting)     Family History  Problem Relation Age of Onset  . Lung cancer Father   . Diabetes Sister   . Healthy Daughter   . Healthy Son   . Healthy Son    Past Surgical History:  Procedure Laterality Date  . ABDOMINAL HYSTERECTOMY    . [place]   per patient  . COLONOSCOPY WITH PROPOFOL N/A 04/16/2019   Procedure: COLONOSCOPY WITH PROPOFOL;  Surgeon: Laurence Spates, MD;  Location: [facility];  Service: Endoscopy;  Laterality: N/A;  . DENTAL SURGERY    . FLEXIBLE SIGMOIDOSCOPY N/A 03/17/2017   Procedure: FLEXIBLE SIGMOIDOSCOPY;  Surgeon: Laurence Spates, MD;  Location: [facility];  Service: Endoscopy;  Laterality: N/A;  . FLEXIBLE SIGMOIDOSCOPY N/A 08/18/2017   Procedure: FLEXIBLE SIGMOIDOSCOPY;  Surgeon: Laurence Spates, MD;  Location: [facility];  Service: Endoscopy;  Laterality: N/A;  . FOOT SURGERY Right 2010  . HOT HEMOSTASIS N/A 03/17/2017   Procedure: HOT HEMOSTASIS (ARGON PLASMA COAGULATION/BICAP);  Surgeon: Laurence Spates, MD;  Location: [facility];  Service: Endoscopy;  Laterality: N/A;  . KNEE SURGERY Right 2005  .  POLYPECTOMY  04/16/2019   Procedure: POLYPECTOMY;  Surgeon: Laurence Spates, MD;  Location: [facility];  Service: Endoscopy;;   Social History   Social History Narrative  . Not on file   Immunization History  Administered Date(s) Administered  . PFIZER SARS-COV-2 Vaccination 07/16/2019, 08/06/2019, 03/24/2020     Objective: Vital Signs: BP 95/67 (BP Location: Left Arm, Patient Position: Sitting, Cuff Size: Normal)   Pulse 69   Resp 14   Ht 5' 6.5" (1.689 m)   Wt 174 lb 3.2 oz (79 kg)   BMI 27.70 kg/m    Physical Exam Vitals and nursing note reviewed.  Constitutional:      Appearance: She is well-developed.  HENT:     Head: Normocephalic and atraumatic.  Eyes:     Conjunctiva/sclera: Conjunctivae normal.  Cardiovascular:     Rate and Rhythm: Normal rate and regular rhythm.     Heart sounds: Normal heart sounds.  Pulmonary:     Effort: Pulmonary effort is normal.     Breath sounds: Normal breath sounds.  Abdominal:     General: Bowel sounds are normal.     Palpations: Abdomen is soft.  Musculoskeletal:     Cervical back: Normal range of motion.  Lymphadenopathy:     Cervical: No cervical adenopathy.  Skin:    General: Skin is warm and dry.     Capillary Refill: Capillary refill takes less than 2 seconds.  Neurological:     Mental Status: She is alert and oriented to person, place, and time.  Psychiatric:        Behavior: Behavior normal.      Musculoskeletal Exam: C-spine was in good range of motion.  Shoulder joints, elbow joints, wrist joints with good range of motion.  She has bilateral PIP and DIP thickening.  Hip joints and knee joints with good range of motion.  She had no tenderness over ankles or MTPs.   CDAI Exam: CDAI Score: -- Patient Global: --; Provider Global: -- Swollen: --; Tender: -- Joint Exam 04/14/2020   No joint exam has been documented for this visit   There is currently no information documented on the homunculus. Go to the  Rheumatology activity and complete the homunculus joint exam.  Investigation: No additional findings.  Imaging: XR Cervical Spine 2 or 3 views  Result Date: 03/18/2020 Multilevel spondylosis was noted.  Narrowing was noted between C3-C4, C5-C6.  Facet joint arthropathy was noted. Impression: These findings are consistent with multilevel spondylosis and facet joint arthropathy.  XR Hand 2 View Left  Result Date: 03/18/2020 Severe CMC narrowing was noted.  PIP and DIP narrowing was noted.  No MCP, intercarpal radiocarpal joint space narrowing was noted.  No erosive changes were noted. Impression: These findings are consistent with osteoarthritis of the hand.  XR Hand 2 View Right  Result Date: 03/18/2020 CMC, PIP and DIP narrowing was noted.  No MCP, intercarpal or radiocarpal joint space narrowing was noted. Impression: These findings are consistent with osteoarthritis of the hand.   Recent Labs: Lab Results  Component Value Date   WBC 5.7 03/18/2020   HGB 14.1 03/18/2020   PLT 266 03/18/2020   NA 140 03/18/2020   K 4.6 03/18/2020  CL 105 03/18/2020   CO2 27 03/18/2020   GLUCOSE 85 03/18/2020   BUN 23 03/18/2020   CREATININE 0.89 03/18/2020   BILITOT 0.2 03/18/2020   AST 15 03/18/2020   ALT 13 03/18/2020   PROT 6.4 03/18/2020   CALCIUM 9.4 03/18/2020   GFRAA 78 03/18/2020   March 18, 2020 ESR 2, RF negative, anti-CCP negative, uric acid 3.7, vitamin D 51, CK 43  Speciality Comments: No specialty comments available.  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  She has spinal stenosis and facet joint arthropathy.  She has seen Dr. Sherwood Gambler in the past.  She was going to a chiropractor and was getting physical therapy there.  She was given a handout on exercises.  She states she has noticed improvement in the stiffness.  Primary osteoarthritis of  both hands-she has PIP and DIP thickening.  Joint protection muscle strengthening was discussed.  All autoimmune labs were normal.  Lab results were discussed with the patient.  Scheduled abnormalities were discussed.  Paresthesia of both hands-nerve conduction velocities are pending.  I discussed the use of carpal tunnel braces.  We will see the results and make recommendations based on that.  Primary osteoarthritis of both knees-patient has been followed at Dr.Olin's office.  She has had cortisone injections in the past and had Visco support injections 8 months ago.  She states the discomfort in her knee joint has returned.  She would like to get viscosupplementation is here due to convenience.  She will bring the copy of her x-rays at the next visit.  We will apply for Visco supplement injections   This patient has experienced inadequate response, adverse effects and/or intolerance with conservative treatments such as acetaminophen, NSAIDS, topical creams, physical therapy or regular exercise, knee bracing and/or weight loss.   This patient has experienced inadequate response or has a contraindication to intra articular steroid injections for at least 3 months.   This patient is not scheduled to have a total knee replacement within 6 months of starting treatment with viscosupplementation.  Primary osteoarthritis of both feet-proper fitting shoes with arch support were discussed.  Pes cavus-arch support will be helpful and emphasized.  Osteopenia of multiple sites-treated and followed by Dr. Helane Rima.   Orders: No orders of the defined types were placed in this encounter.  No orders of the defined types were placed in this encounter.    Follow-Up Instructions: Return in about 1 month (around 05/15/2020) for Osteoarthritis.   Bo Merino, MD  Note - This record has been created using Editor, commissioning.  Chart creation errors have been sought, but may not always  have been located.  Such creation errors do not reflect on  the standard of medical care.

## 2020-04-14 ENCOUNTER — Other Ambulatory Visit: Payer: Self-pay

## 2020-04-14 ENCOUNTER — Encounter: Payer: Self-pay | Admitting: Rheumatology

## 2020-04-14 ENCOUNTER — Ambulatory Visit (INDEPENDENT_AMBULATORY_CARE_PROVIDER_SITE_OTHER): Payer: BC Managed Care – PPO | Admitting: Rheumatology

## 2020-04-14 VITALS — BP 95/67 | HR 69 | Resp 14 | Ht 66.5 in | Wt 174.2 lb

## 2020-04-14 DIAGNOSIS — M19042 Primary osteoarthritis, left hand: Secondary | ICD-10-CM

## 2020-04-14 DIAGNOSIS — M170 Bilateral primary osteoarthritis of knee: Secondary | ICD-10-CM | POA: Diagnosis not present

## 2020-04-14 DIAGNOSIS — M19071 Primary osteoarthritis, right ankle and foot: Secondary | ICD-10-CM

## 2020-04-14 DIAGNOSIS — M5030 Other cervical disc degeneration, unspecified cervical region: Secondary | ICD-10-CM

## 2020-04-14 DIAGNOSIS — Q667 Congenital pes cavus, unspecified foot: Secondary | ICD-10-CM

## 2020-04-14 DIAGNOSIS — R202 Paresthesia of skin: Secondary | ICD-10-CM | POA: Diagnosis not present

## 2020-04-14 DIAGNOSIS — M19041 Primary osteoarthritis, right hand: Secondary | ICD-10-CM

## 2020-04-14 DIAGNOSIS — M19072 Primary osteoarthritis, left ankle and foot: Secondary | ICD-10-CM

## 2020-04-14 DIAGNOSIS — M8589 Other specified disorders of bone density and structure, multiple sites: Secondary | ICD-10-CM | POA: Insufficient documentation

## 2020-04-15 ENCOUNTER — Other Ambulatory Visit: Payer: Self-pay

## 2020-04-15 DIAGNOSIS — R202 Paresthesia of skin: Secondary | ICD-10-CM

## 2020-04-16 ENCOUNTER — Ambulatory Visit (INDEPENDENT_AMBULATORY_CARE_PROVIDER_SITE_OTHER): Payer: BC Managed Care – PPO | Admitting: Diagnostic Neuroimaging

## 2020-04-16 ENCOUNTER — Encounter (INDEPENDENT_AMBULATORY_CARE_PROVIDER_SITE_OTHER): Payer: BC Managed Care – PPO | Admitting: Diagnostic Neuroimaging

## 2020-04-16 ENCOUNTER — Other Ambulatory Visit: Payer: Self-pay

## 2020-04-16 DIAGNOSIS — R202 Paresthesia of skin: Secondary | ICD-10-CM | POA: Diagnosis not present

## 2020-04-16 DIAGNOSIS — Z0289 Encounter for other administrative examinations: Secondary | ICD-10-CM

## 2020-04-16 NOTE — Procedures (Signed)
[facility]  NCS (NERVE CONDUCTION STUDY) WITH EMG (ELECTROMYOGRAPHY) REPORT   STUDY DATE: 04/16/20 PATIENT NAME: Rachel Mccann DOB: 05-Jul-1955 MRN: [id number]  ORDERING CLINICIAN: Pollyann Savoy, MD  TECHNOLOGIST: Durenda Age ELECTROMYOGRAPHER: Glenford Bayley. Wesson Stith, MD  CLINICAL INFORMATION: 64 year old female with intermittent numbness.  FINDINGS: NERVE CONDUCTION STUDY:  Bilateral median and ulnar motor responses are normal.  Bilateral median ulnar sensory responses are normal.  Bilateral median to ulnar transcarpal mixed nerve comparison studies are normal.  Bilateral ulnar F-wave latencies are normal.   NEEDLE ELECTROMYOGRAPHY:  Needle examination of right upper extremity is normal.   IMPRESSION:   Normal study.  No electrodiagnostic evidence of large fiber neuropathy at this time.    INTERPRETING PHYSICIAN:  Suanne Marker, MD Certified in Neurology, Neurophysiology and Neuroimaging  [facility] [address], [address] [place], [place] [address] [phone]   [facility]    Nerve / Sites Muscle Latency Ref. Amplitude Ref. Rel Amp Segments Distance Velocity Ref. Area    ms ms mV mV %  cm m/s m/s mVms  L Median - APB     Wrist APB 3.1 ?4.4 5.3 ?4.0 100 Wrist - APB 7   27.7     Upper arm APB 7.0  4.6  86.2 Upper arm - Wrist 20 51 ?49 24.1  R Median - APB     Wrist APB 3.6 ?4.4 4.0 ?4.0 100 Wrist - APB 7   20.6     Upper arm APB 7.5  4.2  104 Upper arm - Wrist 21 54 ?49 21.8  L Ulnar - ADM     Wrist ADM 2.8 ?3.3 10.6 ?6.0 100 Wrist - ADM 7   34.2     B.Elbow ADM 5.6  7.0  65.9 B.Elbow - Wrist 17 62 ?49 32.4     A.Elbow ADM 7.3  7.1  102 A.Elbow - B.Elbow 10 59 ?49 33.4  R Ulnar - ADM     Wrist ADM 2.7 ?3.3 9.7 ?6.0 100 Wrist - ADM 7   32.5     B.Elbow ADM 5.9  8.5  87.8 B.Elbow - Wrist 18 57 ?49 30.4     A.Elbow ADM 7.6  8.7  102 A.Elbow - B.Elbow 10 59 ?49 27.6             SNC    Nerve / Sites Rec. Site  Peak Lat Ref.  Amp Ref. Segments Distance Peak Diff Ref.    ms ms V V  cm ms ms  L Median, Ulnar - Transcarpal comparison     Median Palm Wrist 1.9 ?2.2 73 ?35 Median Palm - Wrist 8       Ulnar Palm Wrist 1.9 ?2.2 20 ?12 Ulnar Palm - Wrist 8          Median Palm - Ulnar Palm  0.0 ?0.4  R Median, Ulnar - Transcarpal comparison     Median Palm Wrist 2.3 ?2.2 59 ?35 Median Palm - Wrist 8       Ulnar Palm Wrist 2.0 ?2.2 33 ?12 Ulnar Palm - Wrist 8          Median Palm - Ulnar Palm  0.3 ?0.4  L Median - Orthodromic (Dig II, Mid palm)     Dig II Wrist 2.9 ?3.4 15 ?10 Dig II - Wrist 13    R Median - Orthodromic (Dig II, Mid palm)     Dig II Wrist 3.0 ?3.4 14 ?10 Dig II -  Wrist 13    L Ulnar - Orthodromic, (Dig V, Mid palm)     Dig V Wrist 2.5 ?3.1 9 ?5 Dig V - Wrist 11    R Ulnar - Orthodromic, (Dig V, Mid palm)     Dig V Wrist 2.5 ?3.1 12 ?5 Dig V - Wrist 71                   F  Wave    Nerve F Lat Ref.   ms ms  L Ulnar - ADM 24.0 ?32.0  R Ulnar - ADM 25.7 ?32.0         EMG Summary Table    Spontaneous MUAP Recruitment  Muscle IA Fib PSW Fasc Other Amp Dur. Poly Pattern  R. Deltoid Normal None None None _______ Normal Normal Normal Normal  R. Biceps brachii Normal None None None _______ Normal Normal Normal Normal  R. Triceps brachii Normal None None None _______ Normal Normal Normal Normal  R. Flexor carpi radialis Normal None None None _______ Normal Normal Normal Normal  R. First dorsal interosseous Normal None None None _______ Normal Normal Normal Normal

## 2020-04-17 ENCOUNTER — Telehealth: Payer: Self-pay

## 2020-04-17 ENCOUNTER — Telehealth: Payer: Self-pay | Admitting: *Deleted

## 2020-04-17 NOTE — Telephone Encounter (Signed)
Patient advised EMG/nerve conduction loss, study was negative. Patient expressed understanding.

## 2020-04-17 NOTE — Telephone Encounter (Signed)
Attempted to contact the patient and left message for patient to call the office.  

## 2020-04-17 NOTE — Telephone Encounter (Signed)
-----   Message from Pollyann Savoy, MD sent at 04/17/2020  8:17 AM EDT ----- Please notify patient that EMG/nerve conduction loss, study was negative. Pollyann Savoy, MD  ----- Message ----- From: Suanne Marker, MD Sent: 04/16/2020   7:03 PM EDT To: Pollyann Savoy, MD

## 2020-04-17 NOTE — Telephone Encounter (Signed)
Patient called stating she was returning your call.   °

## 2020-04-17 NOTE — Telephone Encounter (Signed)
See previous phone note.  

## 2020-04-21 ENCOUNTER — Telehealth: Payer: Self-pay | Admitting: *Deleted

## 2020-04-21 NOTE — Telephone Encounter (Signed)
error 

## 2020-04-21 NOTE — Progress Notes (Signed)
Nerve conduction velocities in EMG studies are normal.

## 2020-05-05 ENCOUNTER — Telehealth: Payer: Self-pay | Admitting: Rheumatology

## 2020-05-05 NOTE — Telephone Encounter (Signed)
Please call to schedule Visco knee injections.   Authorized for Synvisc series Bilateral Knees. Buy and Bill. Deductible has been met.  PA for Synvisc thru [employer] # [id number] Effective 04/24/2020 thru 10/21/2020 Insurance to cover 80% of allowable cost.

## 2020-05-08 NOTE — Telephone Encounter (Signed)
Spoke with patient, and scheduled.

## 2020-05-14 NOTE — Progress Notes (Deleted)
Office Visit Note  Patient: Rachel Mccann             Date of Birth: [birth year]           MRN: [id number]             PCP: Marcelle Overlie, MD Referring: Marcelle Overlie, MD Visit Date: 05/27/2020 Occupation: @GUAROCC @  Subjective:  No chief complaint on file.   History of Present Illness: Rachel Mccann is a 64 y.o. female ***   Activities of Daily Living:  Patient reports morning stiffness for *** {minute/hour:19697}.   Patient {ACTIONS;DENIES/REPORTS:21021675::"Denies"} nocturnal pain.  Difficulty dressing/grooming: {ACTIONS;DENIES/REPORTS:21021675::"Denies"} Difficulty climbing stairs: {ACTIONS;DENIES/REPORTS:21021675::"Denies"} Difficulty getting out of chair: {ACTIONS;DENIES/REPORTS:21021675::"Denies"} Difficulty using hands for taps, buttons, cutlery, and/or writing: {ACTIONS;DENIES/REPORTS:21021675::"Denies"}  No Rheumatology ROS completed.   PMFS History:  Patient Active Problem List   Diagnosis Date Noted  . Primary osteoarthritis of both knees 04/14/2020  . Primary osteoarthritis of both feet 04/14/2020  . Pes cavus 04/14/2020  . Osteopenia of multiple sites 04/14/2020  . DDD (degenerative disc disease), cervical 04/03/2020  . Primary osteoarthritis of both hands 04/03/2020    Past Medical History:  Diagnosis Date  . PONV (postoperative nausea and vomiting)     Family History  Problem Relation Age of Onset  . Lung cancer Father   . Diabetes Sister   . Healthy Daughter   . Healthy Son   . Healthy Son    Past Surgical History:  Procedure Laterality Date  . ABDOMINAL HYSTERECTOMY    . CESAREAN SECTION  1985, 1987   per patient  . COLONOSCOPY WITH PROPOFOL N/A 04/16/2019   Procedure: COLONOSCOPY WITH PROPOFOL;  Surgeon: 04/18/2019, MD;  Location: [facility];  Service: Endoscopy;  Laterality: N/A;  . DENTAL SURGERY    . FLEXIBLE SIGMOIDOSCOPY N/A 03/17/2017   Procedure: FLEXIBLE SIGMOIDOSCOPY;  Surgeon: 03/19/2017, MD;  Location: [facility];  Service: Endoscopy;  Laterality: N/A;  . FLEXIBLE SIGMOIDOSCOPY N/A 08/18/2017   Procedure: FLEXIBLE SIGMOIDOSCOPY;  Surgeon: 08/20/2017, MD;  Location: [facility];  Service: Endoscopy;  Laterality: N/A;  . FOOT SURGERY Right 2010  . HOT HEMOSTASIS N/A 03/17/2017   Procedure: HOT HEMOSTASIS (ARGON PLASMA COAGULATION/BICAP);  Surgeon: 03/19/2017, MD;  Location: [facility];  Service: Endoscopy;  Laterality: N/A;  . KNEE SURGERY Right 2005  . POLYPECTOMY  04/16/2019   Procedure: POLYPECTOMY;  Surgeon: 04/18/2019, MD;  Location: [facility];  Service: Endoscopy;;   Social History   Social History Narrative  . Not on file   Immunization History  Administered Date(s) Administered  . PFIZER SARS-COV-2 Vaccination 07/16/2019, 08/06/2019, 03/24/2020     Objective: Vital Signs: There were no vitals taken for this visit.   Physical Exam   Musculoskeletal Exam: ***  CDAI Exam: CDAI Score: -- Patient Global: --; Provider Global: -- Swollen: --; Tender: -- Joint Exam 05/27/2020   No joint exam has been documented for this visit   There is currently no information documented on the homunculus. Go to the Rheumatology activity and complete the homunculus joint exam.  Investigation: No additional findings.  Imaging: NCV with EMG(electromyography)  Result Date: 04/16/2020 04/18/2020, MD     04/16/2020  7:03 PM  [facility] NCS (NERVE CONDUCTION STUDY) WITH EMG (ELECTROMYOGRAPHY) REPORT STUDY DATE: 04/16/20 PATIENT NAME: SHAQUELA WEICHERT DOB: 1956-01-05 MRN: [id number] ORDERING CLINICIAN: [id number], MD TECHNOLOGIST: Pollyann Savoy ELECTROMYOGRAPHER: Durenda Age. Penumalli, MD CLINICAL INFORMATION: 64 year old female with intermittent numbness. FINDINGS: NERVE CONDUCTION  STUDY: Bilateral median and ulnar motor responses are normal. Bilateral median ulnar sensory responses are normal. Bilateral median to ulnar transcarpal mixed nerve comparison  studies are normal. Bilateral ulnar F-wave latencies are normal. NEEDLE ELECTROMYOGRAPHY: Needle examination of right upper extremity is normal. IMPRESSION: Normal study.  No electrodiagnostic evidence of large fiber neuropathy at this time. INTERPRETING PHYSICIAN: Suanne Marker, MD Certified in Neurology, Neurophysiology and Neuroimaging [facility] [address], [address] [place], [place] [address] [phone] [facility]   Nerve / Sites Muscle Latency Ref. Amplitude Ref. Rel Amp Segments Distance Velocity Ref. Area   ms ms mV mV %  cm m/s m/s mVms L Median - APB    Wrist APB 3.1 ?4.4 5.3 ?4.0 100 Wrist - APB 7   27.7    Upper arm APB 7.0  4.6  86.2 Upper arm - Wrist 20 51 ?49 24.1 R Median - APB    Wrist APB 3.6 ?4.4 4.0 ?4.0 100 Wrist - APB 7   20.6    Upper arm APB 7.5  4.2  104 Upper arm - Wrist 21 54 ?49 21.8 L Ulnar - ADM    Wrist ADM 2.8 ?3.3 10.6 ?6.0 100 Wrist - ADM 7   34.2    B.Elbow ADM 5.6  7.0  65.9 B.Elbow - Wrist 17 62 ?49 32.4    A.Elbow ADM 7.3  7.1  102 A.Elbow - B.Elbow 10 59 ?49 33.4 R Ulnar - ADM    Wrist ADM 2.7 ?3.3 9.7 ?6.0 100 Wrist - ADM 7   32.5    B.Elbow ADM 5.9  8.5  87.8 B.Elbow - Wrist 18 57 ?49 30.4    A.Elbow ADM 7.6  8.7  102 A.Elbow - B.Elbow 10 59 ?49 27.6           SNC   Nerve / Sites Rec. Site Peak Lat Ref.  Amp Ref. Segments Distance Peak Diff Ref.   ms ms V V  cm ms ms L Median, Ulnar - Transcarpal comparison    Median Palm Wrist 1.9 ?2.2 73 ?35 Median Palm - Wrist 8      Ulnar Palm Wrist 1.9 ?2.2 20 ?12 Ulnar Palm - Wrist 8         Median Palm - Ulnar Palm  0.0 ?0.4 R Median, Ulnar - Transcarpal comparison    Median Palm Wrist 2.3 ?2.2 59 ?35 Median Palm - Wrist 8      Ulnar Palm Wrist 2.0 ?2.2 33 ?12 Ulnar Palm - Wrist 8         Median Palm - Ulnar Palm  0.3 ?0.4 L Median - Orthodromic (Dig II, Mid palm)    Dig II Wrist 2.9 ?3.4 15 ?10 Dig II - Wrist 13   R Median - Orthodromic (Dig II, Mid palm)    Dig II Wrist 3.0 ?3.4 14 ?10 Dig II - Wrist 13   L  Ulnar - Orthodromic, (Dig V, Mid palm)    Dig V Wrist 2.5 ?3.1 9 ?5 Dig V - Wrist 11   R Ulnar - Orthodromic, (Dig V, Mid palm)    Dig V Wrist 2.5 ?3.1 12 ?5 Dig V - Wrist 69                 F  Wave   Nerve F Lat Ref.  ms ms L Ulnar - ADM 24.0 ?32.0 R Ulnar - ADM 25.7 ?32.0       EMG Summary Table  Spontaneous MUAP Recruitment Muscle IA Fib PSW Fasc Other Amp Dur. Poly Pattern R. Deltoid Normal None None None _______ Normal Normal Normal Normal R. Biceps brachii Normal None None None _______ Normal Normal Normal Normal R. Triceps brachii Normal None None None _______ Normal Normal Normal Normal R. Flexor carpi radialis Normal None None None _______ Normal Normal Normal Normal R. First dorsal interosseous Normal None None None _______ Normal Normal Normal Normal    Recent Labs: Lab Results  Component Value Date   WBC 5.7 03/18/2020   HGB 14.1 03/18/2020   PLT 266 03/18/2020   NA 140 03/18/2020   K 4.6 03/18/2020   CL 105 03/18/2020   CO2 27 03/18/2020   GLUCOSE 85 03/18/2020   BUN 23 03/18/2020   CREATININE 0.89 03/18/2020   BILITOT 0.2 03/18/2020   AST 15 03/18/2020   ALT 13 03/18/2020   PROT 6.4 03/18/2020   CALCIUM 9.4 03/18/2020   GFRAA 78 03/18/2020    Speciality Comments: No specialty comments available.  Procedures:  No procedures performed Allergies: Penicillins   Assessment / Plan:     Visit Diagnoses: No diagnosis found.  Orders: No orders of the defined types were placed in this encounter.  No orders of the defined types were placed in this encounter.   Face-to-face time spent with patient was *** minutes. Greater than 50% of time was spent in counseling and coordination of care.  Follow-Up Instructions: No follow-ups on file.   Ellen Henri, CMA  Note - This record has been created using Animal nutritionist.  Chart creation errors have been sought, but may not always  have been located. Such creation errors do not reflect on  the standard of medical  care.

## 2020-05-25 ENCOUNTER — Ambulatory Visit: Payer: Self-pay

## 2020-05-25 ENCOUNTER — Ambulatory Visit (INDEPENDENT_AMBULATORY_CARE_PROVIDER_SITE_OTHER): Payer: BC Managed Care – PPO | Admitting: Rheumatology

## 2020-05-25 ENCOUNTER — Other Ambulatory Visit: Payer: Self-pay

## 2020-05-25 DIAGNOSIS — M170 Bilateral primary osteoarthritis of knee: Secondary | ICD-10-CM

## 2020-05-25 MED ORDER — HYLAN G-F 20 16 MG/2ML IX SOSY
16.0000 mg | PREFILLED_SYRINGE | INTRA_ARTICULAR | Status: AC | PRN
  Administered 2020-05-25: 16 mg via INTRA_ARTICULAR

## 2020-05-25 MED ORDER — LIDOCAINE HCL 1 % IJ SOLN
1.5000 mL | INTRAMUSCULAR | Status: AC | PRN
  Administered 2020-05-25: 1.5 mL

## 2020-05-25 NOTE — Progress Notes (Addendum)
   Procedure Note  Patient: Rachel Mccann             Date of Birth: 1956-04-15           MRN: [id number]             Visit Date: 05/25/2020  Procedures: Visit Diagnoses:  1. Primary osteoarthritis of both knees    Synvisc #1 bilateral knee joint injections  Large Joint Inj: bilateral knee on 05/25/2020 8:02 AM Indications: pain Details: 25 G 1.5 in needle, ultrasound-guided medial approach  Arthrogram: No  Medications (Right): 1.5 mL lidocaine 1 %; 16 mg Hylan 16 MG/2ML Aspirate (Right): 0 mL Medications (Left): 1.5 mL lidocaine 1 %; 16 mg Hylan 16 MG/2ML Aspirate (Left): 0 mL Outcome: tolerated well, no immediate complications Procedure, treatment alternatives, risks and benefits explained, specific risks discussed. Consent was given by the patient. Immediately prior to procedure a time out was called to verify the correct patient, procedure, equipment, support staff and site/side marked as required. Patient was prepped and draped in the usual sterile fashion.     Patient tolerated the procedure well.  Aftercare was discussed.   Pollyann Savoy, MD

## 2020-05-27 ENCOUNTER — Ambulatory Visit: Payer: BC Managed Care – PPO | Admitting: Rheumatology

## 2020-05-27 DIAGNOSIS — M19041 Primary osteoarthritis, right hand: Secondary | ICD-10-CM

## 2020-05-27 DIAGNOSIS — M5030 Other cervical disc degeneration, unspecified cervical region: Secondary | ICD-10-CM

## 2020-05-27 DIAGNOSIS — Q667 Congenital pes cavus, unspecified foot: Secondary | ICD-10-CM

## 2020-05-27 DIAGNOSIS — M8589 Other specified disorders of bone density and structure, multiple sites: Secondary | ICD-10-CM

## 2020-05-27 DIAGNOSIS — R202 Paresthesia of skin: Secondary | ICD-10-CM

## 2020-05-27 DIAGNOSIS — M19072 Primary osteoarthritis, left ankle and foot: Secondary | ICD-10-CM

## 2020-05-27 DIAGNOSIS — M170 Bilateral primary osteoarthritis of knee: Secondary | ICD-10-CM

## 2020-06-01 ENCOUNTER — Other Ambulatory Visit: Payer: Self-pay

## 2020-06-01 ENCOUNTER — Ambulatory Visit: Payer: Self-pay

## 2020-06-01 ENCOUNTER — Ambulatory Visit (INDEPENDENT_AMBULATORY_CARE_PROVIDER_SITE_OTHER): Payer: BC Managed Care – PPO | Admitting: Rheumatology

## 2020-06-01 DIAGNOSIS — M170 Bilateral primary osteoarthritis of knee: Secondary | ICD-10-CM | POA: Diagnosis not present

## 2020-06-01 NOTE — Progress Notes (Signed)
   Procedure Note  Patient: Rachel Mccann             Date of Birth: 28-Dec-1955           MRN: [id number]             Visit Date: 06/01/2020  Procedures: Visit Diagnoses:  1. Primary osteoarthritis of both knees    Synvisc #2 bilateral knee joint injections-ultrasound guided  Large Joint Inj: bilateral knee on 06/01/2020 10:02 AM Indications: pain Details: 25 G 1.5 in needle, ultrasound-guided medial approach  Arthrogram: No  Medications (Right): 1.5 mL lidocaine 1 %; 16 mg Hylan 16 MG/2ML Aspirate (Right): 0 mL Medications (Left): 1.5 mL lidocaine 1 %; 16 mg Hylan 16 MG/2ML Aspirate (Left): 0 mL Outcome: tolerated well, no immediate complications Procedure, treatment alternatives, risks and benefits explained, specific risks discussed. Consent was given by the patient. Immediately prior to procedure a time out was called to verify the correct patient, procedure, equipment, support staff and site/side marked as required. Patient was prepped and draped in the usual sterile fashion.     Patient tolerated the procedure well.  Aftercare was discussed.

## 2020-06-02 MED ORDER — HYLAN G-F 20 16 MG/2ML IX SOSY
16.0000 mg | PREFILLED_SYRINGE | INTRA_ARTICULAR | Status: AC | PRN
  Administered 2020-06-01: 16 mg via INTRA_ARTICULAR

## 2020-06-02 MED ORDER — LIDOCAINE HCL 1 % IJ SOLN
1.5000 mL | INTRAMUSCULAR | Status: AC | PRN
  Administered 2020-06-01: 1.5 mL

## 2020-06-04 ENCOUNTER — Ambulatory Visit: Payer: BC Managed Care – PPO | Admitting: Rheumatology

## 2020-06-08 ENCOUNTER — Other Ambulatory Visit: Payer: Self-pay

## 2020-06-08 ENCOUNTER — Ambulatory Visit: Payer: Self-pay

## 2020-06-08 ENCOUNTER — Ambulatory Visit (INDEPENDENT_AMBULATORY_CARE_PROVIDER_SITE_OTHER): Payer: BC Managed Care – PPO | Admitting: Rheumatology

## 2020-06-08 DIAGNOSIS — M170 Bilateral primary osteoarthritis of knee: Secondary | ICD-10-CM

## 2020-06-08 MED ORDER — LIDOCAINE HCL 1 % IJ SOLN
1.5000 mL | INTRAMUSCULAR | Status: AC | PRN
  Administered 2020-06-08: 1.5 mL

## 2020-06-08 MED ORDER — HYLAN G-F 20 16 MG/2ML IX SOSY
16.0000 mg | PREFILLED_SYRINGE | INTRA_ARTICULAR | Status: AC | PRN
  Administered 2020-06-08: 16 mg via INTRA_ARTICULAR

## 2020-06-08 NOTE — Progress Notes (Signed)
   Procedure Note  Patient: Rachel Mccann             Date of Birth: Jul 16, 1955           MRN: [id number]             Visit Date: 06/08/2020  Procedures: Visit Diagnoses:  1. Primary osteoarthritis of both knees     Large Joint Inj: bilateral knee on 06/08/2020 1:02 PM Indications: pain Details: 27 G 1.5 in needle, medial approach  Arthrogram: No  Medications (Right): 1.5 mL lidocaine 1 %; 16 mg Hylan 16 MG/2ML Medications (Left): 1.5 mL lidocaine 1 %; 16 mg Hylan 16 MG/2ML Outcome: tolerated well, no immediate complications Procedure, treatment alternatives, risks and benefits explained, specific risks discussed. Consent was given by the patient. Immediately prior to procedure a time out was called to verify the correct patient, procedure, equipment, support staff and site/side marked as required. Patient was prepped and draped in the usual sterile fashion.    Patient tolerated the procedure well.  Postprocedure precautions were discussed.  Pollyann Savoy, MD

## 2020-06-30 ENCOUNTER — Ambulatory Visit: Payer: BC Managed Care – PPO | Admitting: Rheumatology

## 2020-09-08 ENCOUNTER — Telehealth: Payer: Self-pay | Admitting: *Deleted

## 2020-09-08 NOTE — Telephone Encounter (Signed)
Patient called office requesting to speak with esthetician. I informed patient she has called the wrong office and this was a GYN office.

## 2020-11-24 NOTE — Progress Notes (Deleted)
Office Visit Note  Patient: Rachel Mccann             Date of Birth: [birth year]           MRN: [id number]             PCP: Marcelle Overlie, MD Referring: Marcelle Overlie, MD Visit Date: 12/08/2020 Occupation: @GUAROCC @  Subjective:  No chief complaint on file.   History of Present Illness: Rachel Mccann is a 65 y.o. female ***   Activities of Daily Living:  Patient reports morning stiffness for *** {minute/hour:19697}.   Patient {ACTIONS;DENIES/REPORTS:21021675::"Denies"} nocturnal pain.  Difficulty dressing/grooming: {ACTIONS;DENIES/REPORTS:21021675::"Denies"} Difficulty climbing stairs: {ACTIONS;DENIES/REPORTS:21021675::"Denies"} Difficulty getting out of chair: {ACTIONS;DENIES/REPORTS:21021675::"Denies"} Difficulty using hands for taps, buttons, cutlery, and/or writing: {ACTIONS;DENIES/REPORTS:21021675::"Denies"}  No Rheumatology ROS completed.   PMFS History:  Patient Active Problem List   Diagnosis Date Noted  . Primary osteoarthritis of both knees 04/14/2020  . Primary osteoarthritis of both feet 04/14/2020  . Pes cavus 04/14/2020  . Osteopenia of multiple sites 04/14/2020  . DDD (degenerative disc disease), cervical 04/03/2020  . Primary osteoarthritis of both hands 04/03/2020    Past Medical History:  Diagnosis Date  . PONV (postoperative nausea and vomiting)     Family History  Problem Relation Age of Onset  . Lung cancer Father   . Diabetes Sister   . Healthy Daughter   . Healthy Son   . Healthy Son    Past Surgical History:  Procedure Laterality Date  . ABDOMINAL HYSTERECTOMY    . CESAREAN SECTION  1985, 1987   per patient  . COLONOSCOPY WITH PROPOFOL N/A 04/16/2019   Procedure: COLONOSCOPY WITH PROPOFOL;  Surgeon: 04/18/2019, MD;  Location: [facility];  Service: Endoscopy;  Laterality: N/A;  . DENTAL SURGERY    . FLEXIBLE SIGMOIDOSCOPY N/A 03/17/2017   Procedure: FLEXIBLE SIGMOIDOSCOPY;  Surgeon: 03/19/2017, MD;  Location: [facility];  Service: Endoscopy;  Laterality: N/A;  . FLEXIBLE SIGMOIDOSCOPY N/A 08/18/2017   Procedure: FLEXIBLE SIGMOIDOSCOPY;  Surgeon: 08/20/2017, MD;  Location: [facility];  Service: Endoscopy;  Laterality: N/A;  . FOOT SURGERY Right 2010  . HOT HEMOSTASIS N/A 03/17/2017   Procedure: HOT HEMOSTASIS (ARGON PLASMA COAGULATION/BICAP);  Surgeon: 03/19/2017, MD;  Location: [facility];  Service: Endoscopy;  Laterality: N/A;  . KNEE SURGERY Right 2005  . POLYPECTOMY  04/16/2019   Procedure: POLYPECTOMY;  Surgeon: 04/18/2019, MD;  Location: [facility];  Service: Endoscopy;;   Social History   Social History Narrative  . Not on file   Immunization History  Administered Date(s) Administered  . PFIZER(Purple Top)SARS-COV-2 Vaccination 07/16/2019, 08/06/2019, 03/24/2020     Objective: Vital Signs: There were no vitals taken for this visit.   Physical Exam   Musculoskeletal Exam: ***  CDAI Exam: CDAI Score: -- Patient Global: --; Provider Global: -- Swollen: --; Tender: -- Joint Exam 12/08/2020   No joint exam has been documented for this visit   There is currently no information documented on the homunculus. Go to the Rheumatology activity and complete the homunculus joint exam.  Investigation: No additional findings.  Imaging: No results found.  Recent Labs: Lab Results  Component Value Date   WBC 5.7 03/18/2020   HGB 14.1 03/18/2020   PLT 266 03/18/2020   NA 140 03/18/2020   K 4.6 03/18/2020   CL 105 03/18/2020   CO2 27 03/18/2020   GLUCOSE 85 03/18/2020   BUN 23 03/18/2020   CREATININE 0.89 03/18/2020   BILITOT 0.2  03/18/2020   AST 15 03/18/2020   ALT 13 03/18/2020   PROT 6.4 03/18/2020   CALCIUM 9.4 03/18/2020   GFRAA 78 03/18/2020    Speciality Comments: No specialty comments available.  Procedures:  No procedures performed Allergies: Penicillins   Assessment / Plan:     Visit Diagnoses: No diagnosis found.  Orders: No orders of  the defined types were placed in this encounter.  No orders of the defined types were placed in this encounter.   Face-to-face time spent with patient was *** minutes. Greater than 50% of time was spent in counseling and coordination of care.  Follow-Up Instructions: No follow-ups on file.   Ellen Henri, CMA  Note - This record has been created using Animal nutritionist.  Chart creation errors have been sought, but may not always  have been located. Such creation errors do not reflect on  the standard of medical care.

## 2020-12-08 ENCOUNTER — Ambulatory Visit: Payer: BC Managed Care – PPO | Admitting: Rheumatology

## 2020-12-08 DIAGNOSIS — M5030 Other cervical disc degeneration, unspecified cervical region: Secondary | ICD-10-CM

## 2020-12-08 DIAGNOSIS — M8589 Other specified disorders of bone density and structure, multiple sites: Secondary | ICD-10-CM

## 2020-12-08 DIAGNOSIS — M19072 Primary osteoarthritis, left ankle and foot: Secondary | ICD-10-CM

## 2020-12-08 DIAGNOSIS — R202 Paresthesia of skin: Secondary | ICD-10-CM

## 2020-12-08 DIAGNOSIS — M19042 Primary osteoarthritis, left hand: Secondary | ICD-10-CM

## 2020-12-08 DIAGNOSIS — Q667 Congenital pes cavus, unspecified foot: Secondary | ICD-10-CM

## 2020-12-08 DIAGNOSIS — M170 Bilateral primary osteoarthritis of knee: Secondary | ICD-10-CM

## 2021-04-15 DIAGNOSIS — Z1272 Encounter for screening for malignant neoplasm of vagina: Secondary | ICD-10-CM | POA: Diagnosis not present

## 2021-04-15 DIAGNOSIS — Z90710 Acquired absence of both cervix and uterus: Secondary | ICD-10-CM | POA: Diagnosis not present

## 2021-04-15 DIAGNOSIS — Z6827 Body mass index (BMI) 27.0-27.9, adult: Secondary | ICD-10-CM | POA: Diagnosis not present

## 2021-04-15 DIAGNOSIS — Z124 Encounter for screening for malignant neoplasm of cervix: Secondary | ICD-10-CM | POA: Diagnosis not present

## 2021-04-15 DIAGNOSIS — Z1231 Encounter for screening mammogram for malignant neoplasm of breast: Secondary | ICD-10-CM | POA: Diagnosis not present

## 2021-04-20 DIAGNOSIS — Z1329 Encounter for screening for other suspected endocrine disorder: Secondary | ICD-10-CM | POA: Diagnosis not present

## 2021-04-20 DIAGNOSIS — Z13228 Encounter for screening for other metabolic disorders: Secondary | ICD-10-CM | POA: Diagnosis not present

## 2021-04-20 DIAGNOSIS — E785 Hyperlipidemia, unspecified: Secondary | ICD-10-CM | POA: Diagnosis not present

## 2021-04-20 DIAGNOSIS — Z130 Encounter for screening for diseases of the blood and blood-forming organs and certain disorders involving the immune mechanism: Secondary | ICD-10-CM | POA: Diagnosis not present

## 2021-04-20 DIAGNOSIS — Z131 Encounter for screening for diabetes mellitus: Secondary | ICD-10-CM | POA: Diagnosis not present

## 2021-05-25 ENCOUNTER — Ambulatory Visit: Payer: PPO | Admitting: Rheumatology

## 2021-05-25 ENCOUNTER — Telehealth: Payer: Self-pay

## 2021-05-25 ENCOUNTER — Encounter: Payer: Self-pay | Admitting: Rheumatology

## 2021-05-25 ENCOUNTER — Other Ambulatory Visit: Payer: Self-pay

## 2021-05-25 VITALS — BP 107/68 | HR 92 | Resp 12 | Ht 65.5 in | Wt 178.0 lb

## 2021-05-25 DIAGNOSIS — M19071 Primary osteoarthritis, right ankle and foot: Secondary | ICD-10-CM | POA: Diagnosis not present

## 2021-05-25 DIAGNOSIS — M19041 Primary osteoarthritis, right hand: Secondary | ICD-10-CM | POA: Diagnosis not present

## 2021-05-25 DIAGNOSIS — R202 Paresthesia of skin: Secondary | ICD-10-CM

## 2021-05-25 DIAGNOSIS — M170 Bilateral primary osteoarthritis of knee: Secondary | ICD-10-CM | POA: Diagnosis not present

## 2021-05-25 DIAGNOSIS — Q667 Congenital pes cavus, unspecified foot: Secondary | ICD-10-CM | POA: Diagnosis not present

## 2021-05-25 DIAGNOSIS — M19042 Primary osteoarthritis, left hand: Secondary | ICD-10-CM

## 2021-05-25 DIAGNOSIS — M19072 Primary osteoarthritis, left ankle and foot: Secondary | ICD-10-CM | POA: Diagnosis not present

## 2021-05-25 DIAGNOSIS — M5030 Other cervical disc degeneration, unspecified cervical region: Secondary | ICD-10-CM | POA: Diagnosis not present

## 2021-05-25 DIAGNOSIS — M8589 Other specified disorders of bone density and structure, multiple sites: Secondary | ICD-10-CM | POA: Diagnosis not present

## 2021-05-25 NOTE — Patient Instructions (Signed)
Hand Exercises °Hand exercises can be helpful for almost anyone. These exercises can strengthen the hands, improve flexibility and movement, and increase blood flow to the hands. These results can make work and daily tasks easier. °Hand exercises can be especially helpful for people who have joint pain from arthritis or have nerve damage from overuse (carpal tunnel syndrome). °These exercises can also help people who have injured a hand. °Exercises °Most of these hand exercises are gentle stretching and motion exercises. It is usually safe to do them often throughout the day. Warming up your hands before exercise may help to reduce stiffness. You can do this with gentle massage or by placing your hands in warm water for 10-15 minutes. °It is normal to feel some stretching, pulling, tightness, or mild discomfort as you begin new exercises. This will gradually improve. Stop an exercise right away if you feel sudden, severe pain or your pain gets worse. Ask your health care provider which exercises are best for you. °Knuckle bend or "claw" fist ° °Stand or sit with your arm, hand, and all five fingers pointed straight up. Make sure to keep your wrist straight during the exercise. °Gently bend your fingers down toward your palm until the tips of your fingers are touching the top of your palm. Keep your big knuckle straight and just bend the small knuckles in your fingers. °Hold this position for __________ seconds. °Straighten (extend) your fingers back to the starting position. °Repeat this exercise 5-10 times with each hand. °Full finger fist ° °Stand or sit with your arm, hand, and all five fingers pointed straight up. Make sure to keep your wrist straight during the exercise. °Gently bend your fingers into your palm until the tips of your fingers are touching the middle of your palm. °Hold this position for __________ seconds. °Extend your fingers back to the starting position, stretching every joint fully. °Repeat  this exercise 5-10 times with each hand. °Straight fist °Stand or sit with your arm, hand, and all five fingers pointed straight up. Make sure to keep your wrist straight during the exercise. °Gently bend your fingers at the big knuckle, where your fingers meet your hand, and the middle knuckle. Keep the knuckle at the tips of your fingers straight and try to touch the bottom of your palm. °Hold this position for __________ seconds. °Extend your fingers back to the starting position, stretching every joint fully. °Repeat this exercise 5-10 times with each hand. °Tabletop ° °Stand or sit with your arm, hand, and all five fingers pointed straight up. Make sure to keep your wrist straight during the exercise. °Gently bend your fingers at the big knuckle, where your fingers meet your hand, as far down as you can while keeping the small knuckles in your fingers straight. Think of forming a tabletop with your fingers. °Hold this position for __________ seconds. °Extend your fingers back to the starting position, stretching every joint fully. °Repeat this exercise 5-10 times with each hand. °Finger spread ° °Place your hand flat on a table with your palm facing down. Make sure your wrist stays straight as you do this exercise. °Spread your fingers and thumb apart from each other as far as you can until you feel a gentle stretch. Hold this position for __________ seconds. °Bring your fingers and thumb tight together again. Hold this position for __________ seconds. °Repeat this exercise 5-10 times with each hand. °Making circles ° °Stand or sit with your arm, hand, and all five fingers pointed   straight up. Make sure to keep your wrist straight during the exercise. °Make a circle by touching the tip of your thumb to the tip of your index finger. °Hold for __________ seconds. Then open your hand wide. °Repeat this motion with your thumb and each finger on your hand. °Repeat this exercise 5-10 times with each hand. °Thumb  motion ° °Sit with your forearm resting on a table and your wrist straight. Your thumb should be facing up toward the ceiling. Keep your fingers relaxed as you move your thumb. °Lift your thumb up as high as you can toward the ceiling. Hold for __________ seconds. °Bend your thumb across your palm as far as you can, reaching the tip of your thumb for the small finger (pinkie) side of your palm. Hold for __________ seconds. °Repeat this exercise 5-10 times with each hand. °Grip strengthening ° °Hold a stress ball or other soft ball in the middle of your hand. °Slowly increase the pressure, squeezing the ball as much as you can without causing pain. Think of bringing the tips of your fingers into the middle of your palm. All of your finger joints should bend when doing this exercise. °Hold your squeeze for __________ seconds, then relax. °Repeat this exercise 5-10 times with each hand. °Contact a health care provider if: °Your hand pain or discomfort gets much worse when you do an exercise. °Your hand pain or discomfort does not improve within 2 hours after you exercise. °If you have any of these problems, stop doing these exercises right away. Do not do them again unless your health care provider says that you can. °Get help right away if: °You develop sudden, severe hand pain or swelling. If this happens, stop doing these exercises right away. Do not do them again unless your health care provider says that you can. °This information is not intended to replace advice given to you by your health care provider. Make sure you discuss any questions you have with your health care provider. °Document Revised: 10/01/2020 Document Reviewed: 10/01/2020 °Elsevier Patient Education © 2022 [employer] ° °Knee Exercises °Ask your health care provider which exercises are safe for you. Do exercises exactly as told by your health care provider and adjust them as directed. It is normal to feel mild stretching, pulling, tightness,  or discomfort as you do these exercises. Stop right away if you feel sudden pain or your pain gets worse. Do not begin these exercises until told by your health care provider. °Stretching and range-of-motion exercises °These exercises warm up your muscles and joints and improve the movement and flexibility of your knee. These exercises also help to relieve pain and swelling. °Knee extension, prone ° °Lie on your abdomen (prone position) on a bed. °Place your left / right knee just beyond the edge of the surface so your knee is not on the bed. You can put a towel under your left / right thigh just above your kneecap for comfort. °Relax your leg muscles and allow gravity to straighten your knee (extension). You should feel a stretch behind your left / right knee. °Hold this position for __________ seconds. °Scoot up so your knee is supported between repetitions. °Repeat __________ times. Complete this exercise __________ times a day. °Knee flexion, active ° °Lie on your back with both legs straight. If this causes back discomfort, bend your left / right knee so your foot is flat on the floor. °Slowly slide your left / right heel back toward your buttocks. Stop   when you feel a gentle stretch in the front of your knee or thigh (flexion). °Hold this position for __________ seconds. °Slowly slide your left / right heel back to the starting position. °Repeat __________ times. Complete this exercise __________ times a day. °Quadriceps stretch, prone ° °Lie on your abdomen on a firm surface, such as a bed or padded floor. °Bend your left / right knee and hold your ankle. If you cannot reach your ankle or pant leg, loop a belt around your foot and grab the belt instead. °Gently pull your heel toward your buttocks. Your knee should not slide out to the side. You should feel a stretch in the front of your thigh and knee (quadriceps). °Hold this position for __________ seconds. °Repeat __________ times. Complete this exercise  __________ times a day. °Hamstring, supine ° °Lie on your back (supine position). °Loop a belt or towel over the ball of your left / right foot. The ball of your foot is on the walking surface, right under your toes. °Straighten your left / right knee and slowly pull on the belt to raise your leg until you feel a gentle stretch behind your knee (hamstring). °Do not let your knee bend while you do this. °Keep your other leg flat on the floor. °Hold this position for __________ seconds. °Repeat __________ times. Complete this exercise __________ times a day. °Strengthening exercises °These exercises build strength and endurance in your knee. Endurance is the ability to use your muscles for a long time, even after they get tired. °Quadriceps, isometric °This exercise strengthens the muscles in front of your thigh (quadriceps) without moving your knee joint (isometric). °Lie on your back with your left / right leg extended and your other knee bent. Put a rolled towel or small pillow under your knee if told by your health care provider. °Slowly tense the muscles in the front of your left / right thigh. You should see your kneecap slide up toward your hip or see increased dimpling just above the knee. This motion will push the back of the knee toward the floor. °For __________ seconds, hold the muscle as tight as you can without increasing your pain. °Relax the muscles slowly and completely. °Repeat __________ times. Complete this exercise __________ times a day. °Straight leg raises °This exercise strengthens the muscles in front of your thigh (quadriceps) and the muscles that move your hips (hip flexors). °Lie on your back with your left / right leg extended and your other knee bent. °Tense the muscles in the front of your left / right thigh. You should see your kneecap slide up or see increased dimpling just above the knee. Your thigh may even shake a bit. °Keep these muscles tight as you raise your leg 4-6 inches  (10-15 cm) off the floor. Do not let your knee bend. °Hold this position for __________ seconds. °Keep these muscles tense as you lower your leg. °Relax your muscles slowly and completely after each repetition. °Repeat __________ times. Complete this exercise __________ times a day. °Hamstring, isometric ° °Lie on your back on a firm surface. °Bend your left / right knee about __________ degrees. °Dig your left / right heel into the surface as if you are trying to pull it toward your buttocks. Tighten the muscles in the back of your thighs (hamstring) to "dig" as hard as you can without increasing any pain. °Hold this position for __________ seconds. °Release the tension gradually and allow your muscles to relax completely for __________   seconds after each repetition. °Repeat __________ times. Complete this exercise __________ times a day. °Hamstring curls °If told by your health care provider, do this exercise while wearing ankle weights. Begin with __________lb / kg weights. Then increase the weight by 1 lb (0.5 kg) increments. Do not wear ankle weights that are more than __________lb / kg. °Lie on your abdomen with your legs straight. °Bend your left / right knee as far as you can without feeling pain. Keep your hips flat against the floor. °Hold this position for __________ seconds. °Slowly lower your leg to the starting position. °Repeat __________ times. Complete this exercise __________ times a day. °Squats °This exercise strengthens the muscles in front of your thigh and knee (quadriceps). °Stand in front of a table, with your feet and knees pointing straight ahead. You may rest your hands on the table for balance but not for support. °Slowly bend your knees and lower your hips like you are going to sit in a chair. °Keep your weight over your heels, not over your toes. °Keep your lower legs upright so they are parallel with the table legs. °Do not let your hips go lower than your knees. °Do not bend lower  than told by your health care provider. °If your knee pain increases, do not bend as low. °Hold the squat position for __________ seconds. °Slowly push with your legs to return to standing. Do not use your hands to pull yourself to standing. °Repeat __________ times. Complete this exercise __________ times a day. °Wall slides °This exercise strengthens the muscles in front of your thigh and knee (quadriceps). °Lean your back against a smooth wall or door, and walk your feet out 18-24 inches (46-61 cm) from it. °Place your feet hip-width apart. °Slowly slide down the wall or door until your knees bend __________ degrees. Keep your knees over your heels, not over your toes. Keep your knees in line with your hips. °Hold this position for __________ seconds. °Repeat __________ times. Complete this exercise __________ times a day. °Straight leg raises, side-lying °This exercise strengthens the muscles that rotate the leg at the hip and move it away from your body (hip abductors). °Lie on your side with your left / right leg in the top position. Lie so your head, shoulder, knee, and hip line up. You may bend your bottom knee to help you keep your balance. °Roll your hips slightly forward so your hips are stacked directly over each other and your left / right knee is facing forward. °Leading with your heel, lift your top leg 4-6 inches (10-15 cm). You should feel the muscles in your outer hip lifting. °Do not let your foot drift forward. °Do not let your knee roll toward the ceiling. °Hold this position for __________ seconds. °Slowly return your leg to the starting position. °Let your muscles relax completely after each repetition. °Repeat __________ times. Complete this exercise __________ times a day. °Straight leg raises, prone °This exercise stretches the muscles that move your hips away from the front of the pelvis (hip extensors). °Lie on your abdomen on a firm surface. You can put a pillow under your hips if that  is more comfortable. °Tense the muscles in your buttocks and lift your left / right leg about 4-6 inches (10-15 cm). Keep your knee straight as you lift your leg. °Hold this position for __________ seconds. °Slowly lower your leg to the starting position. °Let your leg relax completely after each repetition. °Repeat __________ times. Complete this   exercise __________ times a day. °This information is not intended to replace advice given to you by your health care provider. Make sure you discuss any questions you have with your health care provider. °Document Revised: 02/23/2021 Document Reviewed: 02/23/2021 °Elsevier Patient Education © 2022 [employer] ° ° °

## 2021-05-25 NOTE — Progress Notes (Signed)
Office Visit Note  Patient: Rachel Mccann             Date of Birth: Feb 03, 1956           MRN: [id number]             PCP: Dian Queen, MD Referring: Dian Queen, MD Visit Date: 05/25/2021 Occupation: _0 @  Subjective:  Pain in hands and knees.   History of Present Illness: Rachel Mccann is a 65 y.o. female with a history of osteoarthritis.  She states she has been having pain and stiffness in her bilateral hands.  Her CMC joints are painful.  She also has pain and discomfort in her knee joints.  She had very good response to Visco supplement injections.  She would like to have repeat Visco supplement injections.  She states she has had injections in the past but they were not as effective as ultrasound-guided injections.  Activities of Daily Living:  Patient reports morning stiffness for 5 minutes.   Patient Reports nocturnal pain.  Difficulty dressing/grooming: Denies Difficulty climbing stairs: Reports Difficulty getting out of chair: Reports Difficulty using hands for taps, buttons, cutlery, and/or writing: Denies  Review of Systems  Constitutional:  Negative for fatigue, night sweats, weight gain and weight loss.  HENT:  Negative for mouth sores, trouble swallowing, trouble swallowing, mouth dryness and nose dryness.   Eyes:  Negative for pain, redness, visual disturbance and dryness.  Respiratory:  Negative for cough, shortness of breath and difficulty breathing.   Cardiovascular:  Negative for chest pain, palpitations, hypertension, irregular heartbeat and swelling in legs/feet.  Gastrointestinal:  Negative for blood in stool, constipation and diarrhea.  Endocrine: Negative for increased urination.  Genitourinary:  Negative for vaginal dryness.  Musculoskeletal:  Positive for joint pain, joint pain and morning stiffness. Negative for joint swelling, myalgias, muscle weakness, muscle tenderness and myalgias.  Skin:  Negative for color change, rash, hair  loss, skin tightness, ulcers and sensitivity to sunlight.  Allergic/Immunologic: Negative for susceptible to infections.  Neurological:  Negative for dizziness, memory loss, night sweats and weakness.  Hematological:  Negative for swollen glands.  Psychiatric/Behavioral:  Positive for sleep disturbance. Negative for depressed mood. The patient is not nervous/anxious.    PMFS History:  Patient Active Problem List   Diagnosis Date Noted   Primary osteoarthritis of both knees 04/14/2020   Primary osteoarthritis of both feet 04/14/2020   Pes cavus 04/14/2020   Osteopenia of multiple sites 04/14/2020   DDD (degenerative disc disease), cervical 04/03/2020   Primary osteoarthritis of both hands 04/03/2020    Past Medical History:  Diagnosis Date   PONV (postoperative nausea and vomiting)     Family History  Problem Relation Age of Onset   Lung cancer Father    Diabetes Sister    Healthy Daughter    Healthy Son    Healthy Son    Past Surgical History:  Procedure Laterality Date   [place]   per patient   COLONOSCOPY WITH PROPOFOL N/A 04/16/2019   Procedure: COLONOSCOPY WITH PROPOFOL;  Surgeon: Laurence Spates, MD;  Location: [facility];  Service: Endoscopy;  Laterality: N/A;   DENTAL SURGERY     FLEXIBLE SIGMOIDOSCOPY N/A 03/17/2017   Procedure: FLEXIBLE SIGMOIDOSCOPY;  Surgeon: Laurence Spates, MD;  Location: [facility];  Service: Endoscopy;  Laterality: N/A;   FLEXIBLE SIGMOIDOSCOPY N/A 08/18/2017   Procedure: FLEXIBLE SIGMOIDOSCOPY;  Surgeon: Laurence Spates, MD;  Location: [facility];  Service: Endoscopy;  Laterality: N/A;   FOOT SURGERY Right 2010   HOT HEMOSTASIS N/A 03/17/2017   Procedure: HOT HEMOSTASIS (ARGON PLASMA COAGULATION/BICAP);  Surgeon: Laurence Spates, MD;  Location: [facility];  Service: Endoscopy;  Laterality: N/A;   KNEE SURGERY Right 2005   POLYPECTOMY  04/16/2019   Procedure: POLYPECTOMY;  Surgeon: Laurence Spates, MD;  Location: [facility];  Service: Endoscopy;;   Social History   Social History Narrative   Not on file   Immunization History  Administered Date(s) Administered   PFIZER(Purple Top)SARS-COV-2 Vaccination 07/16/2019, 08/06/2019, 03/24/2020     Objective: Vital Signs: BP 107/68 (BP Location: Left Arm, Patient Position: Sitting, Cuff Size: Small)   Pulse 92   Resp 12   Ht 5' 5.5" (1.664 m)   Wt 178 lb (80.7 kg)   BMI 29.17 kg/m    Physical Exam Vitals and nursing note reviewed.  Constitutional:      Appearance: She is well-developed.  HENT:     Head: Normocephalic and atraumatic.  Eyes:     Conjunctiva/sclera: Conjunctivae normal.  Cardiovascular:     Rate and Rhythm: Normal rate and regular rhythm.     Heart sounds: Normal heart sounds.  Pulmonary:     Effort: Pulmonary effort is normal.     Breath sounds: Normal breath sounds.  Abdominal:     General: Bowel sounds are normal.     Palpations: Abdomen is soft.  Musculoskeletal:     Cervical back: Normal range of motion.  Lymphadenopathy:     Cervical: No cervical adenopathy.  Skin:    General: Skin is warm and dry.     Capillary Refill: Capillary refill takes less than 2 seconds.  Neurological:     Mental Status: She is alert and oriented to person, place, and time.  Psychiatric:        Behavior: Behavior normal.     Musculoskeletal Exam: Spine was in good range of motion.  Shoulder joints, elbow joints, wrist joints, MCPs PIPs and DIPs with good range of motion.  She has bilateral CMC pain, PIP, DIP thickening with no synovitis.  Hip joints and knee joints with good range of motion without any warmth swelling or effusion.  She had bilateral pes cavus and first MTP thickening consistent with osteoarthritis.  CDAI Exam: CDAI Score: -- Patient Global: --; Provider Global: -- Swollen: --; Tender: -- Joint Exam 05/25/2021   No joint exam has been documented for this visit   There is currently no  information documented on the homunculus. Go to the Rheumatology activity and complete the homunculus joint exam.  Investigation: No additional findings.  Imaging: No results found.  Recent Labs: Lab Results  Component Value Date   WBC 5.7 03/18/2020   HGB 14.1 03/18/2020   PLT 266 03/18/2020   NA 140 03/18/2020   K 4.6 03/18/2020   CL 105 03/18/2020   CO2 27 03/18/2020   GLUCOSE 85 03/18/2020   BUN 23 03/18/2020   CREATININE 0.89 03/18/2020   BILITOT 0.2 03/18/2020   AST 15 03/18/2020   ALT 13 03/18/2020   PROT 6.4 03/18/2020   CALCIUM 9.4 03/18/2020   GFRAA 78 03/18/2020   March 18, 2020 ESR 2, RF negative, anti-CCP negative, uric acid 3.7, vitamin D 51, CK 43  Speciality Comments: No specialty comments available.  Procedures:  No procedures performed Allergies: Penicillins   Assessment / Plan:     Visit Diagnoses: Primary osteoarthritis of both hands - All autoimmune  labs were negative.  She continues to have pain and discomfort in her bilateral hands especially her CMC joints.  I gave her a prescription for bilateral CMC braces.  A handout on hand exercises was given.  Joint protection muscle strengthening was discussed.  Use of topical Voltaren gel was discussed.  Paresthesia of both hands-she is off-and-on tingling in her hands.  Primary osteoarthritis of both knees - She is followed by Dr. Alvan Dame.  She gets Visco supplement injections on June 08, 2020.  She is experiencing recurrence of pain.  She would like to have repeat Visco supplement injections under ultrasound guidance.  We will apply for Visco supplement injections.  This patient is diagnosed with osteoarthritis of the knee(s).    Radiographs show evidence of joint space narrowing, osteophytes, subchondral sclerosis and/or subchondral cysts.  This patient has knee pain which interferes with functional and activities of daily living.    This patient has experienced inadequate response, adverse  effects and/or intolerance with conservative treatments such as acetaminophen, NSAIDS, topical creams, physical therapy or regular exercise, knee bracing and/or weight loss.   This patient has experienced inadequate response or has a contraindication to intra articular steroid injections for at least 3 months.   This patient is not scheduled to have a total knee replacement within 6 months of starting treatment with viscosupplementation.   Primary osteoarthritis of both feet-she has bilateral first MTP thickening.  She has been having discomfort in her bilateral first MTP joints.  Proper fitting shoes with arch support were discussed.  Pes cavus - Use of arch support was discussed.  DDD (degenerative disc disease), cervical - With spinal stenosis and facet joint arthropathy.  She is a previous patient of Dr. Sherwood Gambler.  Now going to a chiropractor.  Osteopenia of multiple sites - Followed by Dr. Helane Rima.  Orders: No orders of the defined types were placed in this encounter.  No orders of the defined types were placed in this encounter.    Follow-Up Instructions: Return in about 1 year (around 05/25/2022) for Osteoarthritis.   Bo Merino, MD  Note - This record has been created using Editor, commissioning.  Chart creation errors have been sought, but may not always  have been located. Such creation errors do not reflect on  the standard of medical care.

## 2021-05-25 NOTE — Telephone Encounter (Signed)
Please apply for bilateral knee visco per Dr.Deveshwar.   Per Dr. Corliss Skains, please schedule the injections as ultrasound guided. Thanks!

## 2021-05-26 NOTE — Telephone Encounter (Signed)
Submitted for VOB 05/26/2021. 

## 2021-05-27 NOTE — Telephone Encounter (Signed)
Please call patient to schedule Visco knee injections under Ultrasound with Dr. Corliss Skains.  Authorized for Synvisc series Bilateral knees. Buy and Bill. Deductible does not apply. No PA required.  Insurance to cover 100% of allowable cost for administration with a $30.00 copay, and 80% of allowable cost for medication.

## 2021-05-28 ENCOUNTER — Ambulatory Visit: Payer: Self-pay

## 2021-05-28 ENCOUNTER — Ambulatory Visit: Payer: PPO | Admitting: Rheumatology

## 2021-05-28 ENCOUNTER — Other Ambulatory Visit: Payer: Self-pay

## 2021-05-28 DIAGNOSIS — M170 Bilateral primary osteoarthritis of knee: Secondary | ICD-10-CM | POA: Diagnosis not present

## 2021-05-28 MED ORDER — LIDOCAINE HCL 1 % IJ SOLN
1.5000 mL | INTRAMUSCULAR | Status: AC | PRN
Start: 2021-05-28 — End: 2021-05-28
  Administered 2021-05-28: 1.5 mL via INTRA_ARTICULAR

## 2021-05-28 MED ORDER — HYLAN G-F 20 16 MG/2ML IX SOSY
16.0000 mg | PREFILLED_SYRINGE | INTRA_ARTICULAR | Status: AC | PRN
  Administered 2021-05-28: 16 mg via INTRA_ARTICULAR

## 2021-05-28 MED ORDER — LIDOCAINE HCL 1 % IJ SOLN
1.5000 mL | INTRAMUSCULAR | Status: AC | PRN
  Administered 2021-05-28: 1.5 mL via INTRA_ARTICULAR

## 2021-05-28 NOTE — Progress Notes (Signed)
   Procedure Note  Patient: Rachel Mccann             Date of Birth: Jul 01, 1955           MRN: [id number]             Visit Date: 05/28/2021  Procedures: Visit Diagnoses:  1. Primary osteoarthritis of both knees   She had good response to Visco supplement injections in the past.  She is having increased knee joint discomfort.  She requested bilateral knee joint Visco supplement injections.   Synvisc #1 bilateral knees, ultrasound guided. B/B Large Joint Inj: bilateral knee on 05/28/2021 11:21 AM Indications: pain Details: 25 G 1.5 in needle, ultrasound-guided medial approach  Arthrogram: No  Medications (Right): 1.5 mL lidocaine 1 %; 16 mg Hylan 16 MG/2ML Aspirate (Right): 0 mL Medications (Left): 1.5 mL lidocaine 1 %; 16 mg Hylan 16 MG/2ML Aspirate (Left): 0 mL Outcome: tolerated well, no immediate complications Procedure, treatment alternatives, risks and benefits explained, specific risks discussed. Consent was given by the patient. Immediately prior to procedure a time out was called to verify the correct patient, procedure, equipment, support staff and site/side marked as required. Patient was prepped and draped in the usual sterile fashion.     Postprocedure instructions were given.  Pollyann Savoy, MD

## 2021-06-11 ENCOUNTER — Ambulatory Visit: Payer: PPO | Admitting: Rheumatology

## 2021-06-11 ENCOUNTER — Other Ambulatory Visit: Payer: Self-pay

## 2021-06-11 ENCOUNTER — Ambulatory Visit: Payer: Self-pay

## 2021-06-11 DIAGNOSIS — M1711 Unilateral primary osteoarthritis, right knee: Secondary | ICD-10-CM | POA: Diagnosis not present

## 2021-06-11 DIAGNOSIS — M170 Bilateral primary osteoarthritis of knee: Secondary | ICD-10-CM

## 2021-06-11 DIAGNOSIS — M1712 Unilateral primary osteoarthritis, left knee: Secondary | ICD-10-CM | POA: Diagnosis not present

## 2021-06-11 MED ORDER — HYLAN G-F 20 16 MG/2ML IX SOSY
16.0000 mg | PREFILLED_SYRINGE | INTRA_ARTICULAR | Status: AC | PRN
Start: 2021-06-11 — End: 2021-06-11
  Administered 2021-06-11: 16 mg via INTRA_ARTICULAR

## 2021-06-11 MED ORDER — LIDOCAINE HCL 1 % IJ SOLN
1.5000 mL | INTRAMUSCULAR | Status: AC | PRN
Start: 2021-06-11 — End: 2021-06-11
  Administered 2021-06-11: 1.5 mL via INTRA_ARTICULAR

## 2021-06-11 NOTE — Progress Notes (Signed)
° °  Procedure Note  Patient: Rachel Mccann             Date of Birth: 1955/09/25           MRN: [id number]             Visit Date: 06/11/2021  Procedures: Visit Diagnoses:  1. Primary osteoarthritis of both knees    Ultrasound guided injection is preferred based studies that show increased  duration, increased effect, greater accuracy, decreased procedural pain,  increased response rate, and decreased cost with ultrasound guided versus  blind injection.   Verbal informed consent obtained.  Time-out conducted.    Noted no overlying erythema, induration, or other signs of local  infection. Ultrasound-guided knee joint injection: After sterile prep with  Betadine, injected 3 mL of 1% lidocaine and 2 mL Synvisc using a 25 gauge  needle, suprapatellar knee joint recess.  Synvisc #2 bilateral knees, ultrasound guided B/B Large Joint Inj: bilateral knee on 06/11/2021 11:47 AM Indications: pain Details: 25 G 1.5 in needle, ultrasound-guided medial approach  Arthrogram: No  Medications (Right): 1.5 mL lidocaine 1 %; 16 mg Hylan 16 MG/2ML Aspirate (Right): 0 mL Medications (Left): 1.5 mL lidocaine 1 %; 16 mg Hylan 16 MG/2ML Aspirate (Left): 0 mL Outcome: tolerated well, no immediate complications Procedure, treatment alternatives, risks and benefits explained, specific risks discussed. Consent was given by the patient. Immediately prior to procedure a time out was called to verify the correct patient, procedure, equipment, support staff and site/side marked as required. Patient was prepped and draped in the usual sterile fashion.   Postprocedure instructions were given.  Pollyann Savoy, MD

## 2021-06-17 ENCOUNTER — Ambulatory Visit: Payer: PPO | Admitting: Rheumatology

## 2021-06-17 ENCOUNTER — Ambulatory Visit: Payer: Self-pay

## 2021-06-17 ENCOUNTER — Other Ambulatory Visit: Payer: Self-pay

## 2021-06-17 DIAGNOSIS — M170 Bilateral primary osteoarthritis of knee: Secondary | ICD-10-CM

## 2021-06-17 DIAGNOSIS — M1712 Unilateral primary osteoarthritis, left knee: Secondary | ICD-10-CM | POA: Diagnosis not present

## 2021-06-17 DIAGNOSIS — M1711 Unilateral primary osteoarthritis, right knee: Secondary | ICD-10-CM

## 2021-06-17 MED ORDER — HYLAN G-F 20 16 MG/2ML IX SOSY
16.0000 mg | PREFILLED_SYRINGE | INTRA_ARTICULAR | Status: AC | PRN
Start: 2021-06-17 — End: 2021-06-17
  Administered 2021-06-17: 16 mg via INTRA_ARTICULAR

## 2021-06-17 MED ORDER — LIDOCAINE HCL 1 % IJ SOLN
1.5000 mL | INTRAMUSCULAR | Status: AC | PRN
  Administered 2021-06-17: 1.5 mL via INTRA_ARTICULAR

## 2021-06-17 MED ORDER — LIDOCAINE HCL 1 % IJ SOLN
1.5000 mL | INTRAMUSCULAR | Status: AC | PRN
Start: 2021-06-17 — End: 2021-06-17
  Administered 2021-06-17: 1.5 mL via INTRA_ARTICULAR

## 2021-06-17 NOTE — Progress Notes (Signed)
° °  Procedure Note  Patient: Rachel Mccann             Date of Birth: 1955-12-19           MRN: [id number]             Visit Date: 06/17/2021  Procedures: Visit Diagnoses:  1. Primary osteoarthritis of both knees     Ultrasound guided injection is preferred based studies that show increased  duration, increased effect, greater accuracy, decreased procedural pain,  increased response rate, and decreased cost with ultrasound guided versus  blind injection.   Verbal informed consent obtained.  Time-out conducted.    Noted no overlying erythema, induration, or other signs of local  infection. Ultrasound-guided knee joint injection: After sterile prep with  Betadine, injected 3 mL of 1% lidocaine and 2 mL Synvisc using a 25 gauge  needle, suprapatellar knee joint recess.  Synvisc #3 bilateral knees, B/B ultrasound guided Large Joint Inj: bilateral knee on 06/17/2021 11:10 AM Indications: pain Details: 25 G 1.5 in needle, ultrasound-guided medial approach  Arthrogram: No  Medications (Right): 1.5 mL lidocaine 1 %; 16 mg Hylan 16 MG/2ML Aspirate (Right): 0 mL Medications (Left): 1.5 mL lidocaine 1 %; 16 mg Hylan 16 MG/2ML Aspirate (Left): 0 mL Outcome: tolerated well, no immediate complications Procedure, treatment alternatives, risks and benefits explained, specific risks discussed. Consent was given by the patient. Immediately prior to procedure a time out was called to verify the correct patient, procedure, equipment, support staff and site/side marked as required. Patient was prepped and draped in the usual sterile fashion.    Postprocedure instructions were given.  Pollyann Savoy, MD

## 2021-06-18 ENCOUNTER — Ambulatory Visit: Payer: PPO | Admitting: Rheumatology

## 2021-09-13 ENCOUNTER — Other Ambulatory Visit: Payer: Self-pay | Admitting: Internal Medicine

## 2021-09-13 DIAGNOSIS — E785 Hyperlipidemia, unspecified: Secondary | ICD-10-CM

## 2021-10-13 ENCOUNTER — Ambulatory Visit
Admission: RE | Admit: 2021-10-13 | Discharge: 2021-10-13 | Disposition: A | Discharge status: Home / Self Care | Payer: No Typology Code available for payment source | Source: Ambulatory Visit | Attending: Internal Medicine | Admitting: Internal Medicine

## 2021-10-13 DIAGNOSIS — E785 Hyperlipidemia, unspecified: Secondary | ICD-10-CM

## 2021-11-01 ENCOUNTER — Telehealth: Payer: Self-pay | Admitting: Rheumatology

## 2021-11-01 NOTE — Telephone Encounter (Signed)
Patient left a voicemail stating she would like to reapply for visco knee injections. Patients last  injections were u/s guided and not eligible for reinjection until June 23. ?

## 2021-11-02 NOTE — Telephone Encounter (Signed)
I spoke with Dr. Merrilee Jansky to reapply for visco gel injections for both knees.

## 2021-11-02 NOTE — Telephone Encounter (Signed)
VOB submitted for Orthovisc, bilateral knees ?BV pending ? ?

## 2021-11-03 NOTE — Telephone Encounter (Signed)
Please call to schedule visco injections ? ?Approved for Orthovisc, bilateral knees ?[place] ?Patient responsible for 20%  ?Once OOP is met, patient covered at 100% (Met: $5/$3200) ?$25 co-pay ?Deductible does not apply ?No pre-certifications ?

## 2022-01-25 ENCOUNTER — Ambulatory Visit: Payer: PPO

## 2022-01-25 ENCOUNTER — Ambulatory Visit: Payer: PPO | Attending: Rheumatology | Admitting: Rheumatology

## 2022-01-25 DIAGNOSIS — M170 Bilateral primary osteoarthritis of knee: Secondary | ICD-10-CM

## 2022-01-25 MED ORDER — HYALURONAN 30 MG/2ML IX SOSY
30.0000 mg | PREFILLED_SYRINGE | INTRA_ARTICULAR | Status: AC | PRN
  Administered 2022-01-25: 30 mg via INTRA_ARTICULAR

## 2022-01-25 MED ORDER — LIDOCAINE HCL 1 % IJ SOLN
1.5000 mL | INTRAMUSCULAR | Status: AC | PRN
  Administered 2022-01-25: 1.5 mL

## 2022-01-25 NOTE — Progress Notes (Signed)
   Procedure Note  Patient: Rachel Mccann             Date of Birth: 04-25-1956           MRN: [id number]             Visit Date: 01/25/2022  Procedures: Visit Diagnoses:  1. Primary osteoarthritis of both knees    Orthovisc #1 bilateral knees, ultrasound guided, B/B Large Joint Inj: bilateral knee on 01/25/2022 3:27 PM Indications: pain Details: 25 G 1.5 in needle, ultrasound-guided medial approach  Arthrogram: No  Medications (Right): 1.5 mL lidocaine 1 %; 30 mg Hyaluronan 30 MG/2ML Aspirate (Right): 0 mL Medications (Left): 1.5 mL lidocaine 1 %; 30 mg Hyaluronan 30 MG/2ML Aspirate (Left): 0 mL Outcome: tolerated well, no immediate complications Procedure, treatment alternatives, risks and benefits explained, specific risks discussed. Consent was given by the patient. Immediately prior to procedure a time out was called to verify the correct patient, procedure, equipment, support staff and site/side marked as required. Patient was prepped and draped in the usual sterile fashion.     Pollyann Savoy, MD

## 2022-02-01 ENCOUNTER — Ambulatory Visit: Payer: PPO | Attending: Rheumatology | Admitting: Rheumatology

## 2022-02-01 ENCOUNTER — Ambulatory Visit: Payer: PPO

## 2022-02-01 ENCOUNTER — Ambulatory Visit (INDEPENDENT_AMBULATORY_CARE_PROVIDER_SITE_OTHER): Payer: PPO

## 2022-02-01 DIAGNOSIS — M170 Bilateral primary osteoarthritis of knee: Secondary | ICD-10-CM | POA: Diagnosis not present

## 2022-02-01 MED ORDER — LIDOCAINE HCL 1 % IJ SOLN
1.5000 mL | INTRAMUSCULAR | Status: AC | PRN
  Administered 2022-02-01: 1.5 mL

## 2022-02-01 MED ORDER — HYALURONAN 30 MG/2ML IX SOSY
30.0000 mg | PREFILLED_SYRINGE | INTRA_ARTICULAR | Status: AC | PRN
  Administered 2022-02-01: 30 mg via INTRA_ARTICULAR

## 2022-02-01 NOTE — Progress Notes (Signed)
   Procedure Note  Patient: Rachel Mccann             Date of Birth: 06-Aug-1955           MRN: [id number]             Visit Date: 02/01/2022  Procedures: Visit Diagnoses:  1. Primary osteoarthritis of both knees    Orthovisc #2 bilateral knees, ultrasound guided, B/B Large Joint Inj: bilateral knee on 02/01/2022 2:59 PM Indications: pain Details: 22 G 1.5 in needle, ultrasound-guided medial approach  Arthrogram: No  Medications (Right): 1.5 mL lidocaine 1 %; 30 mg Hyaluronan 30 MG/2ML Aspirate (Right): 0 mL Medications (Left): 1.5 mL lidocaine 1 %; 30 mg Hyaluronan 30 MG/2ML Aspirate (Left): 0 mL Outcome: tolerated well, no immediate complications Procedure, treatment alternatives, risks and benefits explained, specific risks discussed. Consent was given by the patient. Immediately prior to procedure a time out was called to verify the correct patient, procedure, equipment, support staff and site/side marked as required. Patient was prepped and draped in the usual sterile fashion.    Postprocedure instructions were given. Pollyann Savoy, MD

## 2022-02-09 ENCOUNTER — Ambulatory Visit (INDEPENDENT_AMBULATORY_CARE_PROVIDER_SITE_OTHER): Payer: PPO

## 2022-02-09 ENCOUNTER — Ambulatory Visit: Payer: PPO | Attending: Rheumatology | Admitting: Rheumatology

## 2022-02-09 ENCOUNTER — Ambulatory Visit: Payer: PPO

## 2022-02-09 DIAGNOSIS — M170 Bilateral primary osteoarthritis of knee: Secondary | ICD-10-CM

## 2022-02-09 MED ORDER — HYALURONAN 30 MG/2ML IX SOSY
30.0000 mg | PREFILLED_SYRINGE | INTRA_ARTICULAR | Status: AC | PRN
  Administered 2022-02-09: 30 mg via INTRA_ARTICULAR

## 2022-02-09 MED ORDER — LIDOCAINE HCL 1 % IJ SOLN
1.5000 mL | INTRAMUSCULAR | Status: AC | PRN
  Administered 2022-02-09: 1.5 mL

## 2022-02-09 NOTE — Progress Notes (Signed)
   Procedure Note  Patient: Rachel Mccann             Date of Birth: [birth year]           MRN: [id number]             Visit Date: 02/09/2022  Procedures: Visit Diagnoses:  1. Primary osteoarthritis of both knees    Orthovisc #3 bilateral knees, B/B, ultrasound guided. Large Joint Inj: bilateral knee on 02/09/2022 2:52 PM Indications: pain Details: 22 G 1.5 in needle, ultrasound-guided medial approach  Arthrogram: No  Medications (Right): 1.5 mL lidocaine 1 %; 30 mg Hyaluronan 30 MG/2ML Aspirate (Right): 0 mL Medications (Left): 1.5 mL lidocaine 1 %; 30 mg Hyaluronan 30 MG/2ML Aspirate (Left): 0 mL Outcome: tolerated well, no immediate complications Procedure, treatment alternatives, risks and benefits explained, specific risks discussed. Consent was given by the patient. Immediately prior to procedure a time out was called to verify the correct patient, procedure, equipment, support staff and site/side marked as required. Patient was prepped and draped in the usual sterile fashion.     Pollyann Savoy, MD

## 2022-06-30 NOTE — Progress Notes (Signed)
Office Visit Note  Patient: Rachel Mccann             Date of Birth: 1955/08/11           MRN: [id number]             PCP: Marcelle Overlie, MD Referring: Marcelle Overlie, MD Visit Date: 07/12/2022 Occupation: @GUAROCC @  Subjective:  Pain in both knees  History of Present Illness: Rachel Mccann is a 66 y.o. female with history of osteoarthritis, degenerative disc disease and osteopenia.  She had last Visco supplement injections to bilateral knee joints in August 2023.  She states she recently started experiencing increased pain and discomfort in her knee joints.  She would like to have repeat viscosupplement injections.  He needs to have pain and discomfort over bilateral CMC joints.  She has been using Voltaren gel and patches.  She has not been using CMC braces as much.  She tries to wear wide fitting shoes for bunions.  She has intermittent stiffness in her cervical spine.  Activities of Daily Living:  Patient reports morning stiffness for 10 minutes.   Patient Denies nocturnal pain.  Difficulty dressing/grooming: Denies Difficulty climbing stairs: Reports Difficulty getting out of chair: Reports Difficulty using hands for taps, buttons, cutlery, and/or writing: Reports  Review of Systems  Constitutional:  Negative for fatigue.  HENT:  Negative for mouth sores and mouth dryness.   Eyes:  Negative for dryness.  Respiratory:  Negative for shortness of breath.   Cardiovascular:  Negative for chest pain and palpitations.  Gastrointestinal:  Negative for blood in stool, constipation and diarrhea.  Endocrine: Negative for increased urination.  Genitourinary:  Positive for involuntary urination.  Musculoskeletal:  Positive for joint pain, joint pain and morning stiffness. Negative for gait problem, joint swelling, myalgias, muscle weakness, muscle tenderness and myalgias.  Skin:  Positive for hair loss. Negative for color change, rash and sensitivity to sunlight.   Allergic/Immunologic: Negative for susceptible to infections.  Neurological:  Negative for dizziness and headaches.  Hematological:  Negative for swollen glands.  Psychiatric/Behavioral:  Positive for sleep disturbance. Negative for depressed mood. The patient is not nervous/anxious.     PMFS History:  Patient Active Problem List   Diagnosis Date Noted   Primary osteoarthritis of both knees 04/14/2020   Primary osteoarthritis of both feet 04/14/2020   Pes cavus 04/14/2020   Osteopenia of multiple sites 04/14/2020   DDD (degenerative disc disease), cervical 04/03/2020   Primary osteoarthritis of both hands 04/03/2020    Past Medical History:  Diagnosis Date   PONV (postoperative nausea and vomiting)     Family History  Problem Relation Age of Onset   Lung cancer Father    Diabetes Sister    Healthy Daughter    Healthy Son    Healthy Son    Past Surgical History:  Procedure Laterality Date   ABDOMINAL HYSTERECTOMY     CESAREAN SECTION  1985, 1987   per patient   COLONOSCOPY WITH PROPOFOL N/A 04/16/2019   Procedure: COLONOSCOPY WITH PROPOFOL;  Surgeon: 04/18/2019, MD;  Location: [facility];  Service: Endoscopy;  Laterality: N/A;   DENTAL SURGERY     FLEXIBLE SIGMOIDOSCOPY N/A 03/17/2017   Procedure: FLEXIBLE SIGMOIDOSCOPY;  Surgeon: 03/19/2017, MD;  Location: [facility];  Service: Endoscopy;  Laterality: N/A;   FLEXIBLE SIGMOIDOSCOPY N/A 08/18/2017   Procedure: FLEXIBLE SIGMOIDOSCOPY;  Surgeon: 08/20/2017, MD;  Location: [facility];  Service: Endoscopy;  Laterality: N/A;   FOOT  SURGERY Right 2010   HOT HEMOSTASIS N/A 03/17/2017   Procedure: HOT HEMOSTASIS (ARGON PLASMA COAGULATION/BICAP);  Surgeon: Laurence Spates, MD;  Location: [facility];  Service: Endoscopy;  Laterality: N/A;   KNEE SURGERY Right 2005   POLYPECTOMY  04/16/2019   Procedure: POLYPECTOMY;  Surgeon: Laurence Spates, MD;  Location: [facility];  Service: Endoscopy;;   Social History    Social History Narrative   Not on file   Immunization History  Administered Date(s) Administered   PFIZER(Purple Top)SARS-COV-2 Vaccination 07/16/2019, 08/06/2019, 03/24/2020     Objective: Vital Signs: BP 115/73 (BP Location: Left Arm, Patient Position: Sitting, Cuff Size: Small)   Pulse 75   Resp 13   Ht 5\' 6"  (1.676 m)   Wt 176 lb (79.8 kg)   BMI 28.41 kg/m    Physical Exam Vitals and nursing note reviewed.  Constitutional:      Appearance: She is well-developed.  HENT:     Head: Normocephalic and atraumatic.  Eyes:     Conjunctiva/sclera: Conjunctivae normal.  Cardiovascular:     Rate and Rhythm: Normal rate and regular rhythm.     Heart sounds: Normal heart sounds.  Pulmonary:     Effort: Pulmonary effort is normal.     Breath sounds: Normal breath sounds.  Abdominal:     General: Bowel sounds are normal.     Palpations: Abdomen is soft.  Musculoskeletal:     Cervical back: Normal range of motion.  Lymphadenopathy:     Cervical: No cervical adenopathy.  Skin:    General: Skin is warm and dry.     Capillary Refill: Capillary refill takes less than 2 seconds.  Neurological:     Mental Status: She is alert and oriented to person, place, and time.  Psychiatric:        Behavior: Behavior normal.      Musculoskeletal Exam: Cervical spine was in good range of motion.  Shoulder joints, elbow joints, wrist joints with good range of motion.  She had bilateral CMC PIP and DIP thickening with no synovitis.  Hip joints and knee joints in good range of motion.  No warmth swelling or effusion was noted.  She had no tenderness over ankles or MTPs.  She had bilateral pes cavus and callus formation under her metatarsals.  CDAI Exam: CDAI Score: -- Patient Global: --; Provider Global: -- Swollen: --; Tender: -- Joint Exam 07/12/2022   No joint exam has been documented for this visit   There is currently no information documented on the homunculus. Go to the  Rheumatology activity and complete the homunculus joint exam.  Investigation: No additional findings.  Imaging: No results found.  Recent Labs: Lab Results  Component Value Date   WBC 5.7 03/18/2020   HGB 14.1 03/18/2020   PLT 266 03/18/2020   NA 140 03/18/2020   K 4.6 03/18/2020   CL 105 03/18/2020   CO2 27 03/18/2020   GLUCOSE 85 03/18/2020   BUN 23 03/18/2020   CREATININE 0.89 03/18/2020   BILITOT 0.2 03/18/2020   AST 15 03/18/2020   ALT 13 03/18/2020   PROT 6.4 03/18/2020   CALCIUM 9.4 03/18/2020   GFRAA 78 03/18/2020    Speciality Comments: No specialty comments available.  Procedures:  No procedures performed Allergies: Penicillins   Assessment / Plan:     Visit Diagnoses: Primary osteoarthritis of both knees - She is followed by Dr. Alvan Dame. s/p orthovisc bilateral knees, ultrasound guided 01/2022.  Patient states that she recently started experiencing  discomfort in her knee joints.  And would like to have repeat viscosupplement injections in March.  I will obtain x-rays of bilateral knee joints today.  X-rays showed bilateral mild medial compartment narrowing with osteophytes and bilateral severe chondromalacia patella.  X-ray findings were discussed with the patient.  Primary osteoarthritis of both hands -she has severe osteoarthritis in her bilateral hands with bilateral CMC PIP and DIP thickening.  Joint protection muscle strengthening was discussed.  She has not been using CMC braces.  She has been using Voltaren gel and some topical agents.  All autoimmune labs were negative.  Paresthesia of both hands-she has intermittent paresthesias in her hands.  Primary osteoarthritis of both feet-she has osteoarthritis in her bilateral feet.  Proper fitting shoes were advised.  Pes cavus-she has bilateral pes cavus.  She had calluses under metatarsal pads.  Arch support was advised.  DDD (degenerative disc disease), cervical - With spinal stenosis and facet joint  arthropathy.  She has intermittent discomfort in the cervical spine.  She is a previous patient of Dr. Sherwood Gambler.  Now going to a chiropractor.  Osteopenia of multiple sites - Followed by Dr. Helane Rima.  Orders: Orders Placed This Encounter  Procedures   XR KNEE 3 VIEW RIGHT   XR KNEE 3 VIEW LEFT   No orders of the defined types were placed in this encounter.   Follow-Up Instructions: Return in about 6 months (around 01/10/2023) for Osteoarthritis.   Bo Merino, MD  Note - This record has been created using Editor, commissioning.  Chart creation errors have been sought, but may not always  have been located. Such creation errors do not reflect on  the standard of medical care.

## 2022-07-12 ENCOUNTER — Ambulatory Visit: Payer: PPO

## 2022-07-12 ENCOUNTER — Telehealth: Payer: Self-pay | Admitting: *Deleted

## 2022-07-12 ENCOUNTER — Encounter: Payer: Self-pay | Admitting: Rheumatology

## 2022-07-12 ENCOUNTER — Ambulatory Visit (INDEPENDENT_AMBULATORY_CARE_PROVIDER_SITE_OTHER): Payer: PPO

## 2022-07-12 ENCOUNTER — Ambulatory Visit: Payer: PPO | Attending: Rheumatology | Admitting: Rheumatology

## 2022-07-12 VITALS — BP 115/73 | HR 75 | Resp 13 | Ht 66.0 in | Wt 176.0 lb

## 2022-07-12 DIAGNOSIS — M19041 Primary osteoarthritis, right hand: Secondary | ICD-10-CM

## 2022-07-12 DIAGNOSIS — M19072 Primary osteoarthritis, left ankle and foot: Secondary | ICD-10-CM

## 2022-07-12 DIAGNOSIS — M19042 Primary osteoarthritis, left hand: Secondary | ICD-10-CM | POA: Diagnosis not present

## 2022-07-12 DIAGNOSIS — Q667 Congenital pes cavus, unspecified foot: Secondary | ICD-10-CM

## 2022-07-12 DIAGNOSIS — M19071 Primary osteoarthritis, right ankle and foot: Secondary | ICD-10-CM | POA: Diagnosis not present

## 2022-07-12 DIAGNOSIS — R202 Paresthesia of skin: Secondary | ICD-10-CM | POA: Diagnosis not present

## 2022-07-12 DIAGNOSIS — M170 Bilateral primary osteoarthritis of knee: Secondary | ICD-10-CM

## 2022-07-12 DIAGNOSIS — M5030 Other cervical disc degeneration, unspecified cervical region: Secondary | ICD-10-CM | POA: Diagnosis not present

## 2022-07-12 DIAGNOSIS — M8589 Other specified disorders of bone density and structure, multiple sites: Secondary | ICD-10-CM | POA: Diagnosis not present

## 2022-07-12 NOTE — Telephone Encounter (Signed)
Please apply for visco - Bil knees. Thank you.

## 2022-07-12 NOTE — Patient Instructions (Signed)

## 2022-07-14 NOTE — Telephone Encounter (Signed)
VOB submitted for Orthovisc, bilateral knees ?BV pending ? ?

## 2022-07-15 NOTE — Telephone Encounter (Signed)
Please call to schedule visco injections.  Approved for Orthovisc, Bilateral knees Buy & Bill Once OOP is met patient covered at 100% (Met: $0/$3200).  Patient is responsible for 20% No co-pay Deductible does not apply Prior Authorization is not required

## 2022-07-21 DIAGNOSIS — H2513 Age-related nuclear cataract, bilateral: Secondary | ICD-10-CM | POA: Diagnosis not present

## 2022-07-21 DIAGNOSIS — H524 Presbyopia: Secondary | ICD-10-CM | POA: Diagnosis not present

## 2022-08-02 NOTE — Telephone Encounter (Signed)
LMOM for patient to call and schedule Orthovisc injections 

## 2022-08-04 ENCOUNTER — Encounter: Payer: Self-pay | Admitting: Podiatry

## 2022-08-04 ENCOUNTER — Ambulatory Visit (INDEPENDENT_AMBULATORY_CARE_PROVIDER_SITE_OTHER): Payer: PPO | Admitting: Podiatry

## 2022-08-04 DIAGNOSIS — M79671 Pain in right foot: Secondary | ICD-10-CM | POA: Diagnosis not present

## 2022-08-04 DIAGNOSIS — M7752 Other enthesopathy of left foot: Secondary | ICD-10-CM | POA: Diagnosis not present

## 2022-08-04 DIAGNOSIS — M779 Enthesopathy, unspecified: Secondary | ICD-10-CM

## 2022-08-04 NOTE — Progress Notes (Signed)
Subjective:   Patient ID: Rachel Mccann, female   DOB: 67 y.o.   MRN: [id number]   HPI Patient presents with history of sharp shooting pain on top of the right foot that is not prevalent now but she still concerned about it and states that she is not sure what happened.  Left foot bothers her to a degree not as bad as it used to in her feet in general can bother her with walking.  Patient does not smoke likes to be active   Review of Systems  All other systems reviewed and are negative.       Objective:  Physical Exam Vitals and nursing note reviewed.  Constitutional:      Appearance: She is well-developed.  Pulmonary:     Effort: Pulmonary effort is normal.  Musculoskeletal:        General: Normal range of motion.  Skin:    General: Skin is warm.  Neurological:     Mental Status: She is alert.     Neurovascular status intact muscle strength adequate range of motion adequate with patient found to have minimal swelling of the dorsal right no indications currently that there is a stress fracture with no range of motion loss also noted.  Left shows mild discomfort second MPJ but nowhere near as bad as it used to be     Assessment:  Overall structural issues also has bunion deformity bilateral possibility for stress fracture right and capsulitis left     Plan:  H&P x-rays reviewed do not recommend treatment currently can use ice on top of her right if needed and if it were to get worse we will have to treat it more aggressively.  Do not recommend treatment of the left at the current time  X-rays indicate no signs of stress fracture right mild bunion deformity no other pathology noted

## 2022-08-22 ENCOUNTER — Ambulatory Visit: Payer: PPO | Attending: Physician Assistant | Admitting: Physician Assistant

## 2022-08-22 DIAGNOSIS — M170 Bilateral primary osteoarthritis of knee: Secondary | ICD-10-CM | POA: Diagnosis not present

## 2022-08-22 MED ORDER — LIDOCAINE HCL 1 % IJ SOLN
1.5000 mL | INTRAMUSCULAR | Status: AC | PRN
  Administered 2022-08-22: 1.5 mL

## 2022-08-22 MED ORDER — HYALURONAN 30 MG/2ML IX SOSY
30.0000 mg | PREFILLED_SYRINGE | INTRA_ARTICULAR | Status: AC | PRN
  Administered 2022-08-22: 30 mg via INTRA_ARTICULAR

## 2022-08-22 NOTE — Progress Notes (Signed)
   Procedure Note  Patient: Rachel Mccann             Date of Birth: Nov 20, 1955           MRN: [id number]             Visit Date: 08/22/2022  Procedures: Visit Diagnoses:  1. Primary osteoarthritis of both knees    Orthovisc #1 bilateral knee joint injections  Large Joint Inj: bilateral knee on 08/22/2022 10:21 AM Indications: pain Details: 25 G 1.5 in needle, medial approach  Arthrogram: No  Medications (Right): 1.5 mL lidocaine 1 %; 30 mg Hyaluronan 30 MG/2ML Aspirate (Right): 0 mL Medications (Left): 1.5 mL lidocaine 1 %; 30 mg Hyaluronan 30 MG/2ML Aspirate (Left): 0 mL Outcome: tolerated well, no immediate complications Procedure, treatment alternatives, risks and benefits explained, specific risks discussed. Consent was given by the patient. Immediately prior to procedure a time out was called to verify the correct patient, procedure, equipment, support staff and site/side marked as required. Patient was prepped and draped in the usual sterile fashion.     Patient tolerated the procedure well.  Aftercare was discussed.  Hazel Sams, PA-C

## 2022-08-29 ENCOUNTER — Ambulatory Visit: Payer: PPO | Attending: Physician Assistant | Admitting: Physician Assistant

## 2022-08-29 DIAGNOSIS — M170 Bilateral primary osteoarthritis of knee: Secondary | ICD-10-CM

## 2022-08-29 MED ORDER — LIDOCAINE HCL 1 % IJ SOLN
1.5000 mL | INTRAMUSCULAR | Status: AC | PRN
  Administered 2022-08-29: 1.5 mL

## 2022-08-29 NOTE — Progress Notes (Signed)
   Procedure Note  Patient: Rachel Mccann             Date of Birth: October 12, 1955           MRN: [id number]             Visit Date: 08/29/2022  Procedures: Visit Diagnoses:  1. Primary osteoarthritis of both knees    Orthovisc #2 Bilateral knee joint injections  Large Joint Inj: bilateral knee on 08/29/2022 7:56 AM Indications: pain Details: 25 G 1.5 in needle, medial approach  Arthrogram: No  Medications (Right): 1.5 mL lidocaine 1 % Aspirate (Right): 0 mL Medications (Left): 1.5 mL lidocaine 1 % Aspirate (Left): 0 mL Outcome: tolerated well, no immediate complications Procedure, treatment alternatives, risks and benefits explained, specific risks discussed. Consent was given by the patient. Immediately prior to procedure a time out was called to verify the correct patient, procedure, equipment, support staff and site/side marked as required. Patient was prepped and draped in the usual sterile fashion.       Patient tolerated the procedure well.  Aftercare was discussed.  Hazel Sams, PA-C

## 2022-09-05 ENCOUNTER — Ambulatory Visit: Payer: PPO | Attending: Physician Assistant | Admitting: Physician Assistant

## 2022-09-05 DIAGNOSIS — M170 Bilateral primary osteoarthritis of knee: Secondary | ICD-10-CM | POA: Diagnosis not present

## 2022-09-05 MED ORDER — HYALURONAN 30 MG/2ML IX SOSY
30.0000 mg | PREFILLED_SYRINGE | INTRA_ARTICULAR | Status: AC | PRN
  Administered 2022-09-05: 30 mg via INTRA_ARTICULAR

## 2022-09-05 MED ORDER — LIDOCAINE HCL 1 % IJ SOLN
1.5000 mL | INTRAMUSCULAR | Status: AC | PRN
  Administered 2022-09-05: 1.5 mL

## 2022-09-05 NOTE — Progress Notes (Signed)
   Procedure Note  Patient: Rachel Mccann             Date of Birth: Nov 16, 1955           MRN: [id number]             Visit Date: 09/05/2022  Procedures: Visit Diagnoses:  1. Primary osteoarthritis of both knees    Orthovisc #3 Bilateral knee joint injections   Large Joint Inj: bilateral knee on 09/05/2022 3:02 PM Indications: pain Details: 25 G 1.5 in needle, medial approach  Arthrogram: No  Medications (Right): 1.5 mL lidocaine 1 %; 30 mg Hyaluronan 30 MG/2ML Aspirate (Right): 0 mL Medications (Left): 1.5 mL lidocaine 1 %; 30 mg Hyaluronan 30 MG/2ML Aspirate (Left): 0 mL Outcome: tolerated well, no immediate complications Procedure, treatment alternatives, risks and benefits explained, specific risks discussed. Consent was given by the patient. Immediately prior to procedure a time out was called to verify the correct patient, procedure, equipment, support staff and site/side marked as required. Patient was prepped and draped in the usual sterile fashion.    Patient tolerated the procedure well.  Aftercare was discussed.  Hazel Sams, PA-C

## 2022-12-27 NOTE — Progress Notes (Deleted)
Office Visit Note  Patient: Rachel Mccann             Date of Birth: [birth year]           MRN: [id number]             PCP: Marcelle Overlie, MD Referring: Marcelle Overlie, MD Visit Date: 01/10/2023 Occupation: @GUAROCC @  Subjective:  No chief complaint on file.   History of Present Illness: Rachel Mccann is a 67 y.o. female ***     Activities of Daily Living:  Patient reports morning stiffness for *** {minute/hour:19697}.   Patient {ACTIONS;DENIES/REPORTS:21021675::"Denies"} nocturnal pain.  Difficulty dressing/grooming: {ACTIONS;DENIES/REPORTS:21021675::"Denies"} Difficulty climbing stairs: {ACTIONS;DENIES/REPORTS:21021675::"Denies"} Difficulty getting out of chair: {ACTIONS;DENIES/REPORTS:21021675::"Denies"} Difficulty using hands for taps, buttons, cutlery, and/or writing: {ACTIONS;DENIES/REPORTS:21021675::"Denies"}  No Rheumatology ROS completed.   PMFS History:  Patient Active Problem List   Diagnosis Date Noted  . Primary osteoarthritis of both knees 04/14/2020  . Primary osteoarthritis of both feet 04/14/2020  . Pes cavus 04/14/2020  . Osteopenia of multiple sites 04/14/2020  . DDD (degenerative disc disease), cervical 04/03/2020  . Primary osteoarthritis of both hands 04/03/2020    Past Medical History:  Diagnosis Date  . PONV (postoperative nausea and vomiting)     Family History  Problem Relation Age of Onset  . Lung cancer Father   . Diabetes Sister   . Healthy Daughter   . Healthy Son   . Healthy Son    Past Surgical History:  Procedure Laterality Date  . ABDOMINAL HYSTERECTOMY    . CESAREAN SECTION  1985, 1987   per patient  . COLONOSCOPY WITH PROPOFOL N/A 04/16/2019   Procedure: COLONOSCOPY WITH PROPOFOL;  Surgeon: Carman Ching, MD;  Location: [facility];  Service: Endoscopy;  Laterality: N/A;  . DENTAL SURGERY    . FLEXIBLE SIGMOIDOSCOPY N/A 03/17/2017   Procedure: FLEXIBLE SIGMOIDOSCOPY;  Surgeon: Carman Ching, MD;  Location: [facility];  Service: Endoscopy;  Laterality: N/A;  . FLEXIBLE SIGMOIDOSCOPY N/A 08/18/2017   Procedure: FLEXIBLE SIGMOIDOSCOPY;  Surgeon: Carman Ching, MD;  Location: [facility];  Service: Endoscopy;  Laterality: N/A;  . FOOT SURGERY Right 2010  . HOT HEMOSTASIS N/A 03/17/2017   Procedure: HOT HEMOSTASIS (ARGON PLASMA COAGULATION/BICAP);  Surgeon: Carman Ching, MD;  Location: [facility];  Service: Endoscopy;  Laterality: N/A;  . KNEE SURGERY Right 2005  . POLYPECTOMY  04/16/2019   Procedure: POLYPECTOMY;  Surgeon: Carman Ching, MD;  Location: [facility];  Service: Endoscopy;;   Social History   Social History Narrative  . Not on file   Immunization History  Administered Date(s) Administered  . PFIZER(Purple Top)SARS-COV-2 Vaccination 07/16/2019, 08/06/2019, 03/24/2020     Objective: Vital Signs: There were no vitals taken for this visit.   Physical Exam   Musculoskeletal Exam: ***  CDAI Exam: CDAI Score: -- Patient Global: --; Provider Global: -- Swollen: --; Tender: -- Joint Exam 01/10/2023   No joint exam has been documented for this visit   There is currently no information documented on the homunculus. Go to the Rheumatology activity and complete the homunculus joint exam.  Investigation: No additional findings.  Imaging: No results found.  Recent Labs: Lab Results  Component Value Date   WBC 5.7 03/18/2020   HGB 14.1 03/18/2020   PLT 266 03/18/2020   NA 140 03/18/2020   K 4.6 03/18/2020   CL 105 03/18/2020   CO2 27 03/18/2020   GLUCOSE 85 03/18/2020   BUN 23 03/18/2020   CREATININE 0.89 03/18/2020  BILITOT 0.2 03/18/2020   AST 15 03/18/2020   ALT 13 03/18/2020   PROT 6.4 03/18/2020   CALCIUM 9.4 03/18/2020   GFRAA 78 03/18/2020    Speciality Comments: No specialty comments available.  Procedures:  No procedures performed Allergies: Penicillins   Assessment / Plan:     Visit Diagnoses: No diagnosis found.  Orders: No orders of  the defined types were placed in this encounter.  No orders of the defined types were placed in this encounter.   Face-to-face time spent with patient was *** minutes. Greater than 50% of time was spent in counseling and coordination of care.  Follow-Up Instructions: No follow-ups on file.   Ellen Henri, CMA  Note - This record has been created using Animal nutritionist.  Chart creation errors have been sought, but may not always  have been located. Such creation errors do not reflect on  the standard of medical care.

## 2023-01-10 ENCOUNTER — Ambulatory Visit: Payer: Self-pay | Admitting: Rheumatology

## 2023-01-10 DIAGNOSIS — M170 Bilateral primary osteoarthritis of knee: Secondary | ICD-10-CM

## 2023-01-10 DIAGNOSIS — M19041 Primary osteoarthritis, right hand: Secondary | ICD-10-CM

## 2023-01-10 DIAGNOSIS — Q667 Congenital pes cavus, unspecified foot: Secondary | ICD-10-CM

## 2023-01-10 DIAGNOSIS — M5030 Other cervical disc degeneration, unspecified cervical region: Secondary | ICD-10-CM

## 2023-01-10 DIAGNOSIS — M19071 Primary osteoarthritis, right ankle and foot: Secondary | ICD-10-CM

## 2023-01-10 DIAGNOSIS — R202 Paresthesia of skin: Secondary | ICD-10-CM

## 2023-01-10 DIAGNOSIS — M8589 Other specified disorders of bone density and structure, multiple sites: Secondary | ICD-10-CM

## 2023-02-06 DIAGNOSIS — M180 Bilateral primary osteoarthritis of first carpometacarpal joints: Secondary | ICD-10-CM | POA: Diagnosis not present

## 2023-03-08 NOTE — Progress Notes (Deleted)
Office Visit Note  Patient: Rachel Mccann             Date of Birth: [birth year]           MRN: [id number]             PCP: Marcelle Overlie, MD Referring: Marcelle Overlie, MD Visit Date: 03/22/2023 Occupation: @GUAROCC @  Subjective:    History of Present Illness: Rachel Mccann is a 67 y.o. female with history of osteoarthritis and DDD.   [place] February/march 2024    Activities of Daily Living:  Patient reports morning stiffness for *** {minute/hour:19697}.   Patient {ACTIONS;DENIES/REPORTS:21021675::"Denies"} nocturnal pain.  Difficulty dressing/grooming: {ACTIONS;DENIES/REPORTS:21021675::"Denies"} Difficulty climbing stairs: {ACTIONS;DENIES/REPORTS:21021675::"Denies"} Difficulty getting out of chair: {ACTIONS;DENIES/REPORTS:21021675::"Denies"} Difficulty using hands for taps, buttons, cutlery, and/or writing: {ACTIONS;DENIES/REPORTS:21021675::"Denies"}  No Rheumatology ROS completed.   PMFS History:  Patient Active Problem List   Diagnosis Date Noted   Primary osteoarthritis of both knees 04/14/2020   Primary osteoarthritis of both feet 04/14/2020   Pes cavus 04/14/2020   Osteopenia of multiple sites 04/14/2020   DDD (degenerative disc disease), cervical 04/03/2020   Primary osteoarthritis of both hands 04/03/2020    Past Medical History:  Diagnosis Date   PONV (postoperative nausea and vomiting)     Family History  Problem Relation Age of Onset   Lung cancer Father    Diabetes Sister    Healthy Daughter    Healthy Son    Healthy Son    Past Surgical History:  Procedure Laterality Date   ABDOMINAL HYSTERECTOMY     CESAREAN SECTION  1985, 1987   per patient   COLONOSCOPY WITH PROPOFOL N/A 04/16/2019   Procedure: COLONOSCOPY WITH PROPOFOL;  Surgeon: Carman Ching, MD;  Location: [facility];  Service: Endoscopy;  Laterality: N/A;   DENTAL SURGERY     FLEXIBLE SIGMOIDOSCOPY N/A 03/17/2017   Procedure: FLEXIBLE SIGMOIDOSCOPY;  Surgeon: Carman Ching,  MD;  Location: [facility];  Service: Endoscopy;  Laterality: N/A;   FLEXIBLE SIGMOIDOSCOPY N/A 08/18/2017   Procedure: FLEXIBLE SIGMOIDOSCOPY;  Surgeon: Carman Ching, MD;  Location: [facility];  Service: Endoscopy;  Laterality: N/A;   FOOT SURGERY Right 2010   HOT HEMOSTASIS N/A 03/17/2017   Procedure: HOT HEMOSTASIS (ARGON PLASMA COAGULATION/BICAP);  Surgeon: Carman Ching, MD;  Location: [facility];  Service: Endoscopy;  Laterality: N/A;   KNEE SURGERY Right 2005   POLYPECTOMY  04/16/2019   Procedure: POLYPECTOMY;  Surgeon: Carman Ching, MD;  Location: [facility];  Service: Endoscopy;;   Social History   Social History Narrative   Not on file   Immunization History  Administered Date(s) Administered   PFIZER(Purple Top)SARS-COV-2 Vaccination 07/16/2019, 08/06/2019, 03/24/2020     Objective: Vital Signs: There were no vitals taken for this visit.   Physical Exam Vitals and nursing note reviewed.  Constitutional:      Appearance: She is well-developed.  HENT:     Head: Normocephalic and atraumatic.  Eyes:     Conjunctiva/sclera: Conjunctivae normal.  Cardiovascular:     Rate and Rhythm: Normal rate and regular rhythm.     Heart sounds: Normal heart sounds.  Pulmonary:     Effort: Pulmonary effort is normal.     Breath sounds: Normal breath sounds.  Abdominal:     General: Bowel sounds are normal.     Palpations: Abdomen is soft.  Musculoskeletal:     Cervical back: Normal range of motion.  Lymphadenopathy:     Cervical: No cervical adenopathy.  Skin:  General: Skin is warm and dry.     Capillary Refill: Capillary refill takes less than 2 seconds.  Neurological:     Mental Status: She is alert and oriented to person, place, and time.  Psychiatric:        Behavior: Behavior normal.      Musculoskeletal Exam: ***  CDAI Exam: CDAI Score: -- Patient Global: --; Provider Global: -- Swollen: --; Tender: -- Joint Exam 03/22/2023   No joint exam has  been documented for this visit   There is currently no information documented on the homunculus. Go to the Rheumatology activity and complete the homunculus joint exam.  Investigation: No additional findings.  Imaging: No results found.  Recent Labs: Lab Results  Component Value Date   WBC 5.7 03/18/2020   HGB 14.1 03/18/2020   PLT 266 03/18/2020   NA 140 03/18/2020   K 4.6 03/18/2020   CL 105 03/18/2020   CO2 27 03/18/2020   GLUCOSE 85 03/18/2020   BUN 23 03/18/2020   CREATININE 0.89 03/18/2020   BILITOT 0.2 03/18/2020   AST 15 03/18/2020   ALT 13 03/18/2020   PROT 6.4 03/18/2020   CALCIUM 9.4 03/18/2020   GFRAA 78 03/18/2020    Speciality Comments: No specialty comments available.  Procedures:  No procedures performed Allergies: Penicillins   Assessment / Plan:     Visit Diagnoses: Primary osteoarthritis of both hands  Paresthesia of both hands  Primary osteoarthritis of both knees  Primary osteoarthritis of both feet  Pes cavus  DDD (degenerative disc disease), cervical  Osteopenia of multiple sites  Orders: No orders of the defined types were placed in this encounter.  No orders of the defined types were placed in this encounter.   Face-to-face time spent with patient was *** minutes. Greater than 50% of time was spent in counseling and coordination of care.  Follow-Up Instructions: No follow-ups on file.   Gearldine Bienenstock, PA-C  Note - This record has been created using Dragon software.  Chart creation errors have been sought, but may not always  have been located. Such creation errors do not reflect on  the standard of medical care.

## 2023-03-22 ENCOUNTER — Ambulatory Visit: Payer: Self-pay | Admitting: Physician Assistant

## 2023-03-22 DIAGNOSIS — M5030 Other cervical disc degeneration, unspecified cervical region: Secondary | ICD-10-CM

## 2023-03-22 DIAGNOSIS — Q667 Congenital pes cavus, unspecified foot: Secondary | ICD-10-CM

## 2023-03-22 DIAGNOSIS — M19041 Primary osteoarthritis, right hand: Secondary | ICD-10-CM

## 2023-03-22 DIAGNOSIS — M170 Bilateral primary osteoarthritis of knee: Secondary | ICD-10-CM

## 2023-03-22 DIAGNOSIS — M8589 Other specified disorders of bone density and structure, multiple sites: Secondary | ICD-10-CM

## 2023-03-22 DIAGNOSIS — M19071 Primary osteoarthritis, right ankle and foot: Secondary | ICD-10-CM

## 2023-03-22 DIAGNOSIS — R202 Paresthesia of skin: Secondary | ICD-10-CM

## 2023-03-31 NOTE — Progress Notes (Unsigned)
Office Visit Note  Patient: Rachel Mccann             Date of Birth: May 11, 1956           MRN: [id number]             PCP: Marcelle Overlie, MD Referring: Marcelle Overlie, MD Visit Date: 04/13/2023 Occupation: @GUAROCC @  Subjective:  Intermittent knee pain   History of Present Illness: Rachel Mccann is a 67 y.o. female with history of osteoarthritis and DDD.  Patient underwent viscosupplementation for both knees in February/March 2024.  She notices significant improvement in her symptoms and was able to be more mobile and active this summer.  She recently was traveling and was having to walk on uneven terrain as well as climbing steps and using an electric bike.  Patient states that while traveling she did not have any difficulty performing the activities she desired.  She has intermittent discomfort in both knee joints but denies any warmth or swelling at this time.  She states that she will be traveling again in April and would like to plan to have another round of viscosupplementation prior to her vacation.  She will notify us when and if she is ready to reapply for viscosupplementation in the future. She has intermittent discomfort in both hands due to underlying osteoarthritis but denies any joint swelling.   Activities of Daily Living:  Patient reports morning stiffness for 2 minutes.   Patient Denies nocturnal pain.  Difficulty dressing/grooming: Reports Difficulty climbing stairs: Reports Difficulty getting out of chair: Denies Difficulty using hands for taps, buttons, cutlery, and/or writing: Reports  Review of Systems  Constitutional:  Negative for fatigue.  HENT:  Negative for mouth sores and mouth dryness.   Eyes:  Negative for dryness.  Respiratory:  Negative for shortness of breath.   Cardiovascular:  Negative for chest pain and palpitations.  Gastrointestinal:  Positive for constipation. Negative for blood in stool and diarrhea.  Endocrine: Positive for  increased urination.  Genitourinary:  Negative for involuntary urination.  Musculoskeletal:  Positive for joint pain, joint pain and morning stiffness. Negative for gait problem, joint swelling, myalgias, muscle weakness, muscle tenderness and myalgias.  Skin:  Positive for hair loss. Negative for color change, rash and sensitivity to sunlight.  Allergic/Immunologic: Positive for susceptible to infections.  Neurological:  Negative for dizziness and headaches.  Hematological:  Negative for swollen glands.  Psychiatric/Behavioral:  Positive for sleep disturbance. Negative for depressed mood. The patient is not nervous/anxious.     PMFS History:  Patient Active Problem List   Diagnosis Date Noted   Primary osteoarthritis of both knees 04/14/2020   Primary osteoarthritis of both feet 04/14/2020   Pes cavus 04/14/2020   Osteopenia of multiple sites 04/14/2020   DDD (degenerative disc disease), cervical 04/03/2020   Primary osteoarthritis of both hands 04/03/2020    Past Medical History:  Diagnosis Date   PONV (postoperative nausea and vomiting)     Family History  Problem Relation Age of Onset   Lung cancer Father    Diabetes Sister    Healthy Daughter    Healthy Son    Healthy Son    Past Surgical History:  Procedure Laterality Date   ABDOMINAL HYSTERECTOMY     CESAREAN SECTION  1985, 1987   per patient   COLONOSCOPY WITH PROPOFOL N/A 04/16/2019   Procedure: COLONOSCOPY WITH PROPOFOL;  Surgeon: Carman Ching, MD;  Location: [facility];  Service: Endoscopy;  Laterality: N/A;  DENTAL SURGERY     FLEXIBLE SIGMOIDOSCOPY N/A 03/17/2017   Procedure: FLEXIBLE SIGMOIDOSCOPY;  Surgeon: Carman Ching, MD;  Location: [facility];  Service: Endoscopy;  Laterality: N/A;   FLEXIBLE SIGMOIDOSCOPY N/A 08/18/2017   Procedure: FLEXIBLE SIGMOIDOSCOPY;  Surgeon: Carman Ching, MD;  Location: [facility];  Service: Endoscopy;  Laterality: N/A;   FOOT SURGERY Right 2010   HOT HEMOSTASIS N/A  03/17/2017   Procedure: HOT HEMOSTASIS (ARGON PLASMA COAGULATION/BICAP);  Surgeon: Carman Ching, MD;  Location: [facility];  Service: Endoscopy;  Laterality: N/A;   KNEE SURGERY Right 2005   POLYPECTOMY  04/16/2019   Procedure: POLYPECTOMY;  Surgeon: Carman Ching, MD;  Location: [facility];  Service: Endoscopy;;   Social History   Social History Narrative   Not on file   Immunization History  Administered Date(s) Administered   PFIZER(Purple Top)SARS-COV-2 Vaccination 07/16/2019, 08/06/2019, 03/24/2020     Objective: Vital Signs: BP 94/63 (BP Location: Left Arm, Patient Position: Sitting, Cuff Size: Normal)   Pulse 72   Resp 14   Ht 5' 6.5" (1.689 m)   Wt 177 lb (80.3 kg)   BMI 28.14 kg/m    Physical Exam Vitals and nursing note reviewed.  Constitutional:      Appearance: She is well-developed.  HENT:     Head: Normocephalic and atraumatic.  Eyes:     Conjunctiva/sclera: Conjunctivae normal.  Cardiovascular:     Rate and Rhythm: Normal rate and regular rhythm.     Heart sounds: Normal heart sounds.  Pulmonary:     Effort: Pulmonary effort is normal.     Breath sounds: Normal breath sounds.  Abdominal:     General: Bowel sounds are normal.     Palpations: Abdomen is soft.  Musculoskeletal:     Cervical back: Normal range of motion.  Lymphadenopathy:     Cervical: No cervical adenopathy.  Skin:    General: Skin is warm and dry.     Capillary Refill: Capillary refill takes less than 2 seconds.  Neurological:     Mental Status: She is alert and oriented to person, place, and time.  Psychiatric:        Behavior: Behavior normal.      Musculoskeletal Exam: C-spine, thoracic spine, lumbar spine good range of motion.  Shoulder joints, elbow joints, wrist joints, MCPs, PIPs, DIPs have good range of motion with no synovitis.  CMC joint thickening noted bilaterally.  PIP and DIP thickening consistent with osteoarthritis of both hands.  Hip joints have good ROM with  no groin pain.  Knee joints have good ROM with no warmth or effusion.  Ankle joints have good range of motion with no tenderness or joint swelling.  CDAI Exam: CDAI Score: -- Patient Global: --; Provider Global: -- Swollen: --; Tender: -- Joint Exam 04/13/2023   No joint exam has been documented for this visit   There is currently no information documented on the homunculus. Go to the Rheumatology activity and complete the homunculus joint exam.  Investigation: No additional findings.  Imaging: No results found.  Recent Labs: Lab Results  Component Value Date   WBC 5.7 03/18/2020   HGB 14.1 03/18/2020   PLT 266 03/18/2020   NA 140 03/18/2020   K 4.6 03/18/2020   CL 105 03/18/2020   CO2 27 03/18/2020   GLUCOSE 85 03/18/2020   BUN 23 03/18/2020   CREATININE 0.89 03/18/2020   BILITOT 0.2 03/18/2020   AST 15 03/18/2020   ALT 13 03/18/2020   PROT 6.4  03/18/2020   CALCIUM 9.4 03/18/2020   GFRAA 78 03/18/2020    Speciality Comments: No specialty comments available.  Procedures:  No procedures performed Allergies: Penicillins   Assessment / Plan:     Visit Diagnoses: Primary osteoarthritis of both hands: PIP and DIP thickening consistent with osteoarthritis of both hands.  CMC joint thickening noted bilaterally.  She experiences intermittent discomfort in her CMC joints.  No synovitis noted on examination today.  She was able to make a complete fist bilaterally.  Paresthesia of both hands: Not symptomatic currently.   Primary osteoarthritis of both knees -She experiences intermittent arthralgias and joint stiffness in both knees.  She has good range of motion of both knee joints on examination today.  No warmth or effusion noted.  She underwent viscosupplementation for both knees in February/March 2024.  She had a significant improvement in her knee joint pain and mobility after undergoing viscosupplementation.  She recently was on vacation was able to walk on uneven  terrain, climb steps, and use an electric bike without difficulty.  Her next vacation is scheduled for April 2025.  She would like to have updated viscosupplementation for both knees prior to her vacation.  She will notify us when she would like for [place] to reapply for viscosupplementation for both knees.  Primary osteoarthritis of both feet: No increased discomfort in her feet currently.  Both ankle joints have good ROM with no tenderness or joint swelling.   Pes cavus  DDD (degenerative disc disease), cervical - spinal stenosis and facet joint arthropathy. She is a previous patient of Dr. Newell Coral. Sees a chiropractor PRN. She has good ROM of the C-spine on examination today.    Osteopenia of multiple sites - Followed by Dr. Vincente Poli. She is taking a calcium and vitamin D supplement daily.   Orders: No orders of the defined types were placed in this encounter.  No orders of the defined types were placed in this encounter.    Follow-Up Instructions: Return in about 8 months (around 12/12/2023) for Osteoarthritis, DDD.   Gearldine Bienenstock, PA-C  Note - This record has been created using Dragon software.  Chart creation errors have been sought, but may not always  have been located. Such creation errors do not reflect on  the standard of medical care.

## 2023-04-07 DIAGNOSIS — Z1389 Encounter for screening for other disorder: Secondary | ICD-10-CM | POA: Diagnosis not present

## 2023-04-07 DIAGNOSIS — E785 Hyperlipidemia, unspecified: Secondary | ICD-10-CM | POA: Diagnosis not present

## 2023-04-07 DIAGNOSIS — M8580 Other specified disorders of bone density and structure, unspecified site: Secondary | ICD-10-CM | POA: Diagnosis not present

## 2023-04-13 ENCOUNTER — Encounter: Payer: Self-pay | Admitting: Physician Assistant

## 2023-04-13 ENCOUNTER — Ambulatory Visit: Payer: PPO | Attending: Physician Assistant | Admitting: Physician Assistant

## 2023-04-13 VITALS — BP 94/63 | HR 72 | Resp 14 | Ht 66.5 in | Wt 177.0 lb

## 2023-04-13 DIAGNOSIS — M5030 Other cervical disc degeneration, unspecified cervical region: Secondary | ICD-10-CM

## 2023-04-13 DIAGNOSIS — M19041 Primary osteoarthritis, right hand: Secondary | ICD-10-CM | POA: Diagnosis not present

## 2023-04-13 DIAGNOSIS — R202 Paresthesia of skin: Secondary | ICD-10-CM

## 2023-04-13 DIAGNOSIS — M8589 Other specified disorders of bone density and structure, multiple sites: Secondary | ICD-10-CM

## 2023-04-13 DIAGNOSIS — M19071 Primary osteoarthritis, right ankle and foot: Secondary | ICD-10-CM

## 2023-04-13 DIAGNOSIS — M19042 Primary osteoarthritis, left hand: Secondary | ICD-10-CM | POA: Diagnosis not present

## 2023-04-13 DIAGNOSIS — M19072 Primary osteoarthritis, left ankle and foot: Secondary | ICD-10-CM

## 2023-04-13 DIAGNOSIS — M170 Bilateral primary osteoarthritis of knee: Secondary | ICD-10-CM | POA: Diagnosis not present

## 2023-04-13 DIAGNOSIS — Q667 Congenital pes cavus, unspecified foot: Secondary | ICD-10-CM

## 2023-04-13 NOTE — Patient Instructions (Signed)
Knee Exercises Ask your health care provider which exercises are safe for you. Do exercises exactly as told by your health care provider and adjust them as directed. It is normal to feel mild stretching, pulling, tightness, or discomfort as you do these exercises. Stop right away if you feel sudden pain or your pain gets worse. Do not begin these exercises until told by your health care provider. Stretching and range-of-motion exercises These exercises warm up your muscles and joints and improve the movement and flexibility of your knee. These exercises also help to relieve pain and swelling. Knee extension, prone  Lie on your abdomen (prone position) on a bed. Place your left / right knee just beyond the edge of the surface so your knee is not on the bed. You can put a towel under your left / right thigh just above your kneecap for comfort. Relax your leg muscles and allow gravity to straighten your knee (extension). You should feel a stretch behind your left / right knee. Hold this position for __________ seconds. Scoot up so your knee is supported between repetitions. Repeat __________ times. Complete this exercise __________ times a day. Knee flexion, active  Lie on your back with both legs straight. If this causes back discomfort, bend your left / right knee so your foot is flat on the floor. Slowly slide your left / right heel back toward your buttocks. Stop when you feel a gentle stretch in the front of your knee or thigh (flexion). Hold this position for __________ seconds. Slowly slide your left / right heel back to the starting position. Repeat __________ times. Complete this exercise __________ times a day. Quadriceps stretch, prone  Lie on your abdomen on a firm surface, such as a bed or padded floor. Bend your left / right knee and hold your ankle. If you cannot reach your ankle or pant leg, loop a belt around your foot and grab the belt instead. Gently pull your heel toward your  buttocks. Your knee should not slide out to the side. You should feel a stretch in the front of your thigh and knee (quadriceps). Hold this position for __________ seconds. Repeat __________ times. Complete this exercise __________ times a day. Hamstring, supine  Lie on your back (supine position). Loop a belt or towel over the ball of your left / right foot. The ball of your foot is on the walking surface, right under your toes. Straighten your left / right knee and slowly pull on the belt to raise your leg until you feel a gentle stretch behind your knee (hamstring). Do not let your knee bend while you do this. Keep your other leg flat on the floor. Hold this position for __________ seconds. Repeat __________ times. Complete this exercise __________ times a day. Strengthening exercises These exercises build strength and endurance in your knee. Endurance is the ability to use your muscles for a long time, even after they get tired. Quadriceps, isometric This exercise strengthens the muscles in front of your thigh (quadriceps) without moving your knee joint (isometric). Lie on your back with your left / right leg extended and your other knee bent. Put a rolled towel or small pillow under your knee if told by your health care provider. Slowly tense the muscles in the front of your left / right thigh. You should see your kneecap slide up toward your hip or see increased dimpling just above the knee. This motion will push the back of the knee toward the floor.  For __________ seconds, hold the muscle as tight as you can without increasing your pain. Relax the muscles slowly and completely. Repeat __________ times. Complete this exercise __________ times a day. Straight leg raises This exercise strengthens the muscles in front of your thigh (quadriceps) and the muscles that move your hips (hip flexors). Lie on your back with your left / right leg extended and your other knee bent. Tense the  muscles in the front of your left / right thigh. You should see your kneecap slide up or see increased dimpling just above the knee. Your thigh may even shake a bit. Keep these muscles tight as you raise your leg 4-6 inches (10-15 cm) off the floor. Do not let your knee bend. Hold this position for __________ seconds. Keep these muscles tense as you lower your leg. Relax your muscles slowly and completely after each repetition. Repeat __________ times. Complete this exercise __________ times a day. Hamstring, isometric  Lie on your back on a firm surface. Bend your left / right knee about __________ degrees. Dig your left / right heel into the surface as if you are trying to pull it toward your buttocks. Tighten the muscles in the back of your thighs (hamstring) to "dig" as hard as you can without increasing any pain. Hold this position for __________ seconds. Release the tension gradually and allow your muscles to relax completely for __________ seconds after each repetition. Repeat __________ times. Complete this exercise __________ times a day. Hamstring curls If told by your health care provider, do this exercise while wearing ankle weights. Begin with __________lb / kg weights. Then increase the weight by 1 lb (0.5 kg) increments. Do not wear ankle weights that are more than __________lb / kg. Lie on your abdomen with your legs straight. Bend your left / right knee as far as you can without feeling pain. Keep your hips flat against the floor. Hold this position for __________ seconds. Slowly lower your leg to the starting position. Repeat __________ times. Complete this exercise __________ times a day. Squats This exercise strengthens the muscles in front of your thigh and knee (quadriceps). Stand in front of a table, with your feet and knees pointing straight ahead. You may rest your hands on the table for balance but not for support. Slowly bend your knees and lower your hips like you  are going to sit in a chair. Keep your weight over your heels, not over your toes. Keep your lower legs upright so they are parallel with the table legs. Do not let your hips go lower than your knees. Do not bend lower than told by your health care provider. If your knee pain increases, do not bend as low. Hold the squat position for __________ seconds. Slowly push with your legs to return to standing. Do not use your hands to pull yourself to standing. Repeat __________ times. Complete this exercise __________ times a day. Wall slides This exercise strengthens the muscles in front of your thigh and knee (quadriceps). Lean your back against a smooth wall or door, and walk your feet out 18-24 inches (46-61 cm) from it. Place your feet hip-width apart. Slowly slide down the wall or door until your knees bend __________ degrees. Keep your knees over your heels, not over your toes. Keep your knees in line with your hips. Hold this position for __________ seconds. Repeat __________ times. Complete this exercise __________ times a day. Straight leg raises, side-lying This exercise strengthens the muscles that rotate  the leg at the hip and move it away from your body (hip abductors). Lie on your side with your left / right leg in the top position. Lie so your head, shoulder, knee, and hip line up. You may bend your bottom knee to help you keep your balance. Roll your hips slightly forward so your hips are stacked directly over each other and your left / right knee is facing forward. Leading with your heel, lift your top leg 4-6 inches (10-15 cm). You should feel the muscles in your outer hip lifting. Do not let your foot drift forward. Do not let your knee roll toward the ceiling. Hold this position for __________ seconds. Slowly return your leg to the starting position. Let your muscles relax completely after each repetition. Repeat __________ times. Complete this exercise __________ times a  day. Straight leg raises, prone This exercise stretches the muscles that move your hips away from the front of the pelvis (hip extensors). Lie on your abdomen on a firm surface. You can put a pillow under your hips if that is more comfortable. Tense the muscles in your buttocks and lift your left / right leg about 4-6 inches (10-15 cm). Keep your knee straight as you lift your leg. Hold this position for __________ seconds. Slowly lower your leg to the starting position. Let your leg relax completely after each repetition. Repeat __________ times. Complete this exercise __________ times a day. This information is not intended to replace advice given to you by your health care provider. Make sure you discuss any questions you have with your health care provider. Document Revised: 02/23/2021 Document Reviewed: 02/23/2021 Elsevier Patient Education  2024 [employer].

## 2023-04-20 DIAGNOSIS — Z0000 Encounter for general adult medical examination without abnormal findings: Secondary | ICD-10-CM | POA: Diagnosis not present

## 2023-04-20 DIAGNOSIS — Z6828 Body mass index (BMI) 28.0-28.9, adult: Secondary | ICD-10-CM | POA: Diagnosis not present

## 2023-04-20 DIAGNOSIS — M8580 Other specified disorders of bone density and structure, unspecified site: Secondary | ICD-10-CM | POA: Diagnosis not present

## 2023-04-20 DIAGNOSIS — Z860100 Personal history of colon polyps, unspecified: Secondary | ICD-10-CM | POA: Diagnosis not present

## 2023-04-20 DIAGNOSIS — Z1331 Encounter for screening for depression: Secondary | ICD-10-CM | POA: Diagnosis not present

## 2023-04-20 DIAGNOSIS — E663 Overweight: Secondary | ICD-10-CM | POA: Diagnosis not present

## 2023-04-20 DIAGNOSIS — E785 Hyperlipidemia, unspecified: Secondary | ICD-10-CM | POA: Diagnosis not present

## 2023-04-20 DIAGNOSIS — Z1339 Encounter for screening examination for other mental health and behavioral disorders: Secondary | ICD-10-CM | POA: Diagnosis not present

## 2023-04-20 DIAGNOSIS — M170 Bilateral primary osteoarthritis of knee: Secondary | ICD-10-CM | POA: Diagnosis not present

## 2023-07-11 ENCOUNTER — Other Ambulatory Visit: Payer: Self-pay | Admitting: Obstetrics and Gynecology

## 2023-07-11 DIAGNOSIS — N644 Mastodynia: Secondary | ICD-10-CM | POA: Diagnosis not present

## 2023-07-11 DIAGNOSIS — Z6828 Body mass index (BMI) 28.0-28.9, adult: Secondary | ICD-10-CM | POA: Diagnosis not present

## 2023-07-11 DIAGNOSIS — M8588 Other specified disorders of bone density and structure, other site: Secondary | ICD-10-CM | POA: Diagnosis not present

## 2023-07-11 DIAGNOSIS — Z124 Encounter for screening for malignant neoplasm of cervix: Secondary | ICD-10-CM | POA: Diagnosis not present

## 2023-07-11 DIAGNOSIS — Z1272 Encounter for screening for malignant neoplasm of vagina: Secondary | ICD-10-CM | POA: Diagnosis not present

## 2023-07-11 DIAGNOSIS — N958 Other specified menopausal and perimenopausal disorders: Secondary | ICD-10-CM | POA: Diagnosis not present

## 2023-07-19 DIAGNOSIS — D485 Neoplasm of uncertain behavior of skin: Secondary | ICD-10-CM | POA: Diagnosis not present

## 2023-07-19 DIAGNOSIS — L570 Actinic keratosis: Secondary | ICD-10-CM | POA: Diagnosis not present

## 2023-07-19 DIAGNOSIS — B079 Viral wart, unspecified: Secondary | ICD-10-CM | POA: Diagnosis not present

## 2023-07-19 DIAGNOSIS — L821 Other seborrheic keratosis: Secondary | ICD-10-CM | POA: Diagnosis not present

## 2023-07-20 ENCOUNTER — Ambulatory Visit
Admission: RE | Admit: 2023-07-20 | Discharge: 2023-07-20 | Disposition: A | Discharge status: Home / Self Care | Payer: PPO | Source: Ambulatory Visit | Attending: Obstetrics and Gynecology | Admitting: Obstetrics and Gynecology

## 2023-07-20 ENCOUNTER — Other Ambulatory Visit: Payer: PPO

## 2023-07-20 DIAGNOSIS — N644 Mastodynia: Secondary | ICD-10-CM

## 2023-07-20 DIAGNOSIS — N6012 Diffuse cystic mastopathy of left breast: Secondary | ICD-10-CM | POA: Diagnosis not present

## 2023-07-31 IMAGING — CT CT CARDIAC CORONARY ARTERY CALCIUM SCORE
4 series · 12 of 20 positions shown, 13 images · non-contrast
Comparison: None.

CLINICAL DATA: 65-year-old white female with hyperlipidemia.
Unspecified hyperlipidemia type.

EXAM:
CT CARDIAC CORONARY ARTERY CALCIUM SCORE
TECHNIQUE: Non-contrast imaging through the heart was performed using
prospective ECG gating. Image post processing was performed on an
independent workstation, allowing for quantitative analysis of the
heart and coronary arteries. Note that this exam targets the heart
and the chest was not imaged in its entirety.

[Series 2: calcium scoring 2.00 qr36 bestdiast 70% hrt calciu · axial · 0.39mm/px · z∈[+1487,+1555]mm · 3 of 70 slices shown, 4 images (1 of 2)]
[im 18/70  vessel]
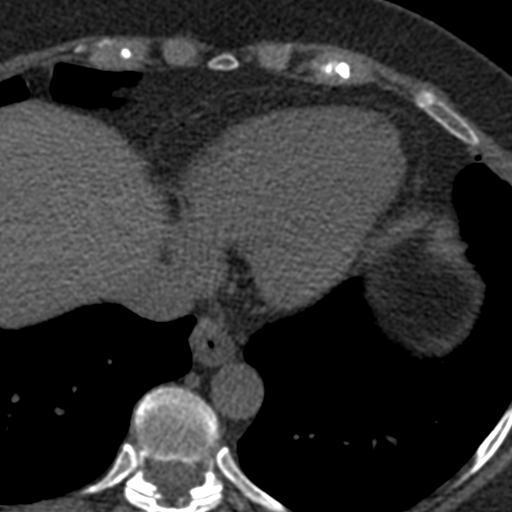
[im 18/70  lung]
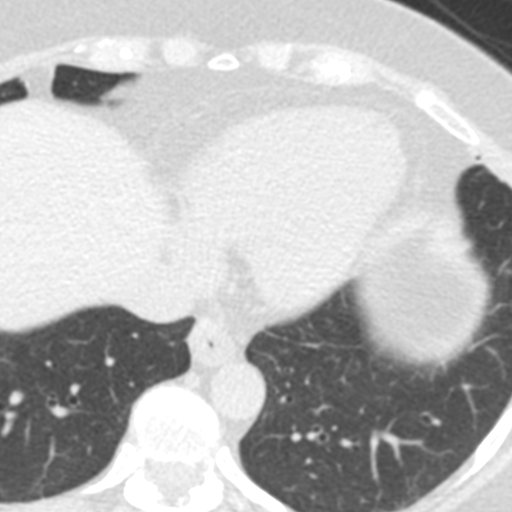
[im 35/70  vessel]
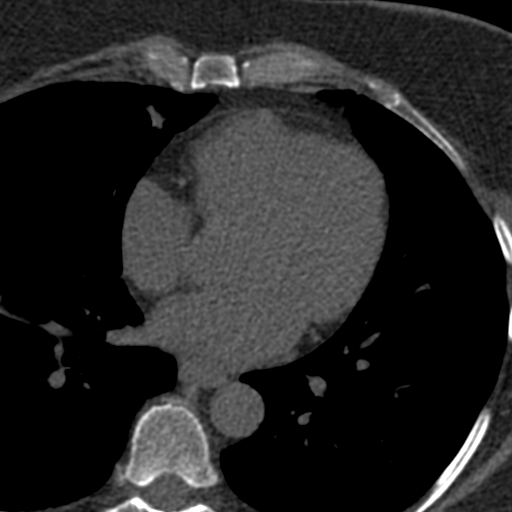
[im 52/70  vessel]
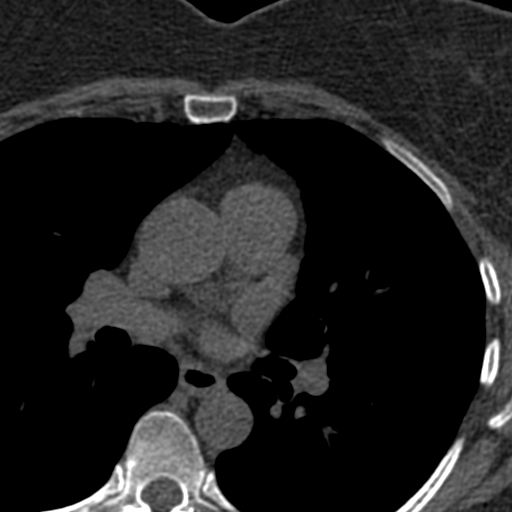

[Series 3: calcium scoring 2.00 br40 bestdiast 70% axial · axial · 0.60mm/px · z∈[+1487,+1555]mm · 3 of 70 slices shown]
[im 18/70  vessel]
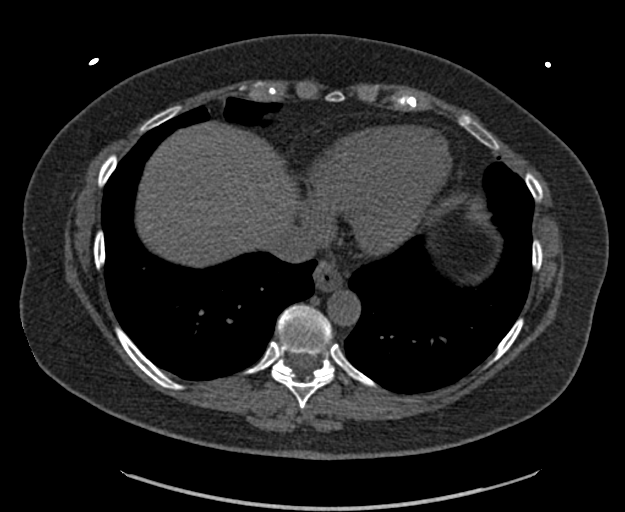
[im 35/70  vessel]
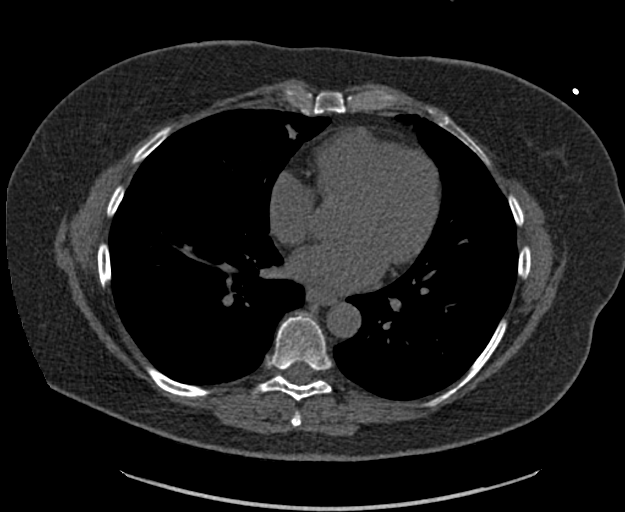
[im 52/70  vessel]
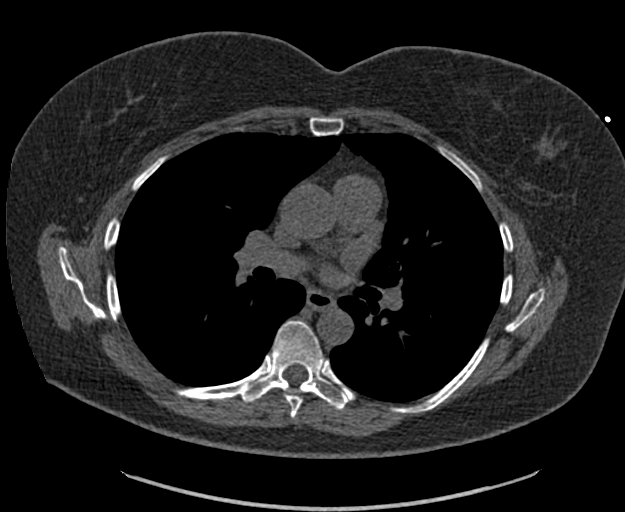

[Series 9: calcium scoring 2.00 br60 bestdiast 70% lungs · axial · 0.60mm/px · z∈[+1487,+1555]mm · 3 of 70 slices shown]
[im 18/70  vessel]
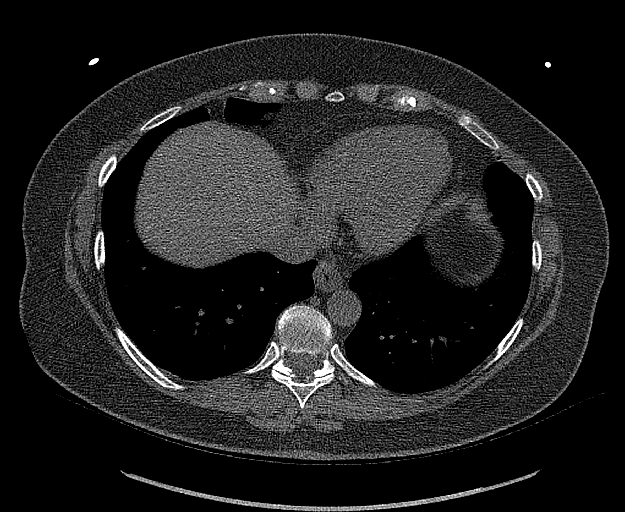
[im 35/70  vessel]
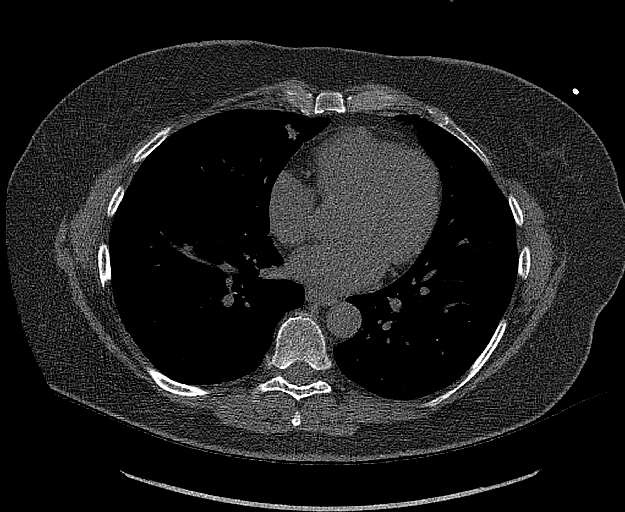
[im 52/70  vessel]
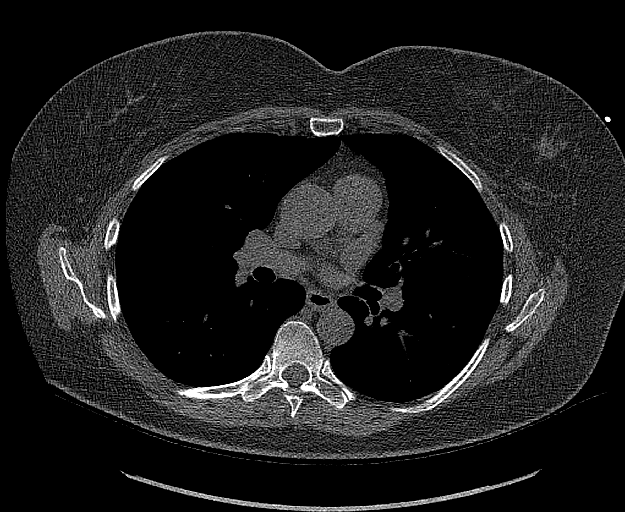

[Series 11: calcium scoring 2.00 qr36 bestdiast 70% hrt calciu · axial · 0.39mm/px · z∈[+1487,+1555]mm · 3 of 70 slices shown (2 of 2)]
[im 18/70  vessel]
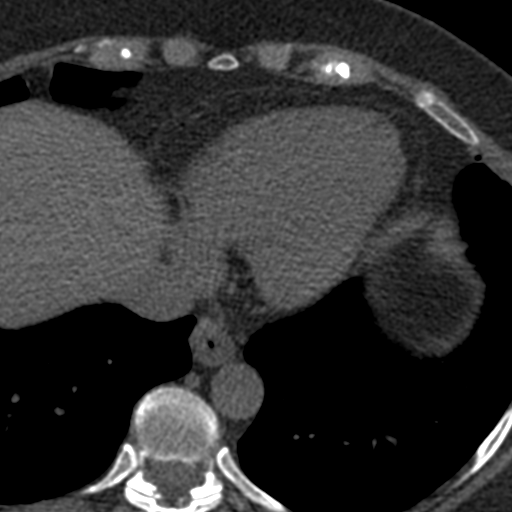
[im 35/70  vessel]
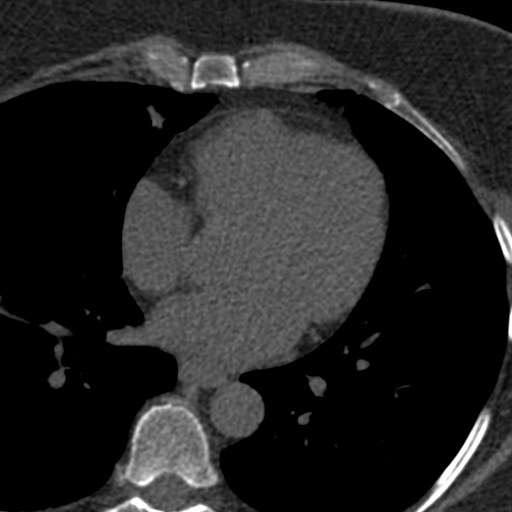
[im 52/70  vessel]
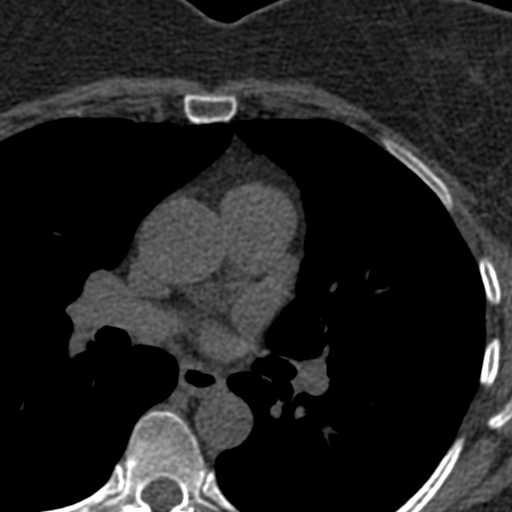

[12 of 20 positions shown; findings below may reference images not displayed]

FINDINGS: CORONARY CALCIUM SCORES:

Left Main: 0

LAD: 0

LCx: 0

RCA: 0

Total Agatston Score: 0

[HOSPITAL] percentile: 0

AORTA MEASUREMENTS:

Ascending Aorta: 34 mm

Descending Aorta: 24 mm

OTHER FINDINGS:

Heart size is normal. No significant pericardial effusion. Small
hiatal hernia. Otherwise, the visualized mediastinal structures are
normal. Images of the upper abdomen are unremarkable. Few streaky
and linear densities in the visualized lungs are suggestive for
atelectasis or scarring. No significant airspace disease or
consolidation in the lungs. No large pleural effusions. No acute
bone abnormality.
IMPRESSION: 1. Coronary calcium score is 0.
2. Small hiatal hernia.

## 2023-08-10 ENCOUNTER — Telehealth: Payer: Self-pay | Admitting: Rheumatology

## 2023-08-10 NOTE — Telephone Encounter (Signed)
VOB submitted for Orthovisc, Bilateral knee(s) BV pending

## 2023-08-10 NOTE — Telephone Encounter (Signed)
Please call to schedule visco injections.  Approved for Orthovisc, Bilateral knee(s). [employer] [phone] co-pay Patient may be required for multiple copays per date of service Deductible does not apply Once the OOP has been met $3400 (met $115) patient is covered at 100% Prior authorization not required

## 2023-08-29 ENCOUNTER — Ambulatory Visit: Payer: PPO | Attending: Physician Assistant | Admitting: Physician Assistant

## 2023-08-29 DIAGNOSIS — M170 Bilateral primary osteoarthritis of knee: Secondary | ICD-10-CM

## 2023-08-29 MED ORDER — LIDOCAINE HCL 1 % IJ SOLN
1.5000 mL | INTRAMUSCULAR | Status: AC | PRN
Start: 2023-08-29 — End: 2023-08-29
  Administered 2023-08-29: 1.5 mL

## 2023-08-29 MED ORDER — HYALURONAN 30 MG/2ML IX SOSY
30.0000 mg | PREFILLED_SYRINGE | INTRA_ARTICULAR | Status: AC | PRN
Start: 2023-08-29 — End: 2023-08-29
  Administered 2023-08-29: 30 mg via INTRA_ARTICULAR

## 2023-08-29 MED ORDER — LIDOCAINE HCL 1 % IJ SOLN
1.5000 mL | INTRAMUSCULAR | Status: AC | PRN
  Administered 2023-08-29: 1.5 mL

## 2023-08-29 MED ORDER — HYALURONAN 30 MG/2ML IX SOSY
30.0000 mg | PREFILLED_SYRINGE | INTRA_ARTICULAR | Status: AC | PRN
  Administered 2023-08-29: 30 mg via INTRA_ARTICULAR

## 2023-08-29 NOTE — Progress Notes (Signed)
   Procedure Note  Patient: Rachel Mccann             Date of Birth: [birth year]           MRN: [id number]             Visit Date: 08/29/2023  Procedures: Visit Diagnoses:  1. Primary osteoarthritis of both knees    Orthovisc #1 bilateral knees, B/B Large Joint Inj: bilateral knee on 08/29/2023 2:33 PM Indications: pain Details: 25 G 1.5 in needle, medial approach  Arthrogram: No  Medications (Right): 1.5 mL lidocaine 1 %; 30 mg Hyaluronan 30 MG/2ML Aspirate (Right): 0 mL Medications (Left): 1.5 mL lidocaine 1 %; 30 mg Hyaluronan 30 MG/2ML Aspirate (Left): 0 mL Outcome: tolerated well, no immediate complications Procedure, treatment alternatives, risks and benefits explained, specific risks discussed. Consent was given by the patient. Immediately prior to procedure a time out was called to verify the correct patient, procedure, equipment, support staff and site/side marked as required.       Patient tolerated the procedures well.  Aftercare was discussed.  Sherron Ales, PA-C

## 2023-09-05 ENCOUNTER — Ambulatory Visit: Payer: PPO | Attending: Physician Assistant | Admitting: Physician Assistant

## 2023-09-05 DIAGNOSIS — M170 Bilateral primary osteoarthritis of knee: Secondary | ICD-10-CM

## 2023-09-05 MED ORDER — HYALURONAN 30 MG/2ML IX SOSY
30.0000 mg | PREFILLED_SYRINGE | INTRA_ARTICULAR | Status: AC | PRN
  Administered 2023-09-05: 30 mg via INTRA_ARTICULAR

## 2023-09-05 MED ORDER — LIDOCAINE HCL 1 % IJ SOLN
1.5000 mL | INTRAMUSCULAR | Status: AC | PRN
  Administered 2023-09-05: 1.5 mL

## 2023-09-05 NOTE — Progress Notes (Signed)
   Procedure Note  Patient: Rachel Mccann             Date of Birth: 1956/04/03           MRN: [id number]             Visit Date: 09/05/2023  Procedures: Visit Diagnoses:  1. Primary osteoarthritis of both knees    Orthovisc #2 bilateral knees, B/B Large Joint Inj: bilateral knee on 09/05/2023 2:25 PM Indications: pain Details: 25 G 1.5 in needle, medial approach  Arthrogram: No  Medications (Right): 1.5 mL lidocaine 1 %; 30 mg Hyaluronan 30 MG/2ML Aspirate (Right): 0 mL Medications (Left): 1.5 mL lidocaine 1 %; 30 mg Hyaluronan 30 MG/2ML Aspirate (Left): 0 mL Outcome: tolerated well, no immediate complications Procedure, treatment alternatives, risks and benefits explained, specific risks discussed. Consent was given by the patient. Immediately prior to procedure a time out was called to verify the correct patient, procedure, equipment, support staff and site/side marked as required.   Patient tolerated the procedures well. Aftercare was discussed.  Sherron Ales, PA-C

## 2023-09-12 ENCOUNTER — Ambulatory Visit: Payer: PPO | Attending: Physician Assistant | Admitting: Physician Assistant

## 2023-09-12 DIAGNOSIS — M170 Bilateral primary osteoarthritis of knee: Secondary | ICD-10-CM

## 2023-09-12 MED ORDER — HYALURONAN 30 MG/2ML IX SOSY
30.0000 mg | PREFILLED_SYRINGE | INTRA_ARTICULAR | Status: AC | PRN
  Administered 2023-09-12: 30 mg via INTRA_ARTICULAR

## 2023-09-12 MED ORDER — LIDOCAINE HCL 1 % IJ SOLN
1.5000 mL | INTRAMUSCULAR | Status: AC | PRN
  Administered 2023-09-12: 1.5 mL

## 2023-09-12 NOTE — Progress Notes (Signed)
   Procedure Note  Patient: Rachel Mccann             Date of Birth: May 03, 1956           MRN: [id number]             Visit Date: 09/12/2023  Procedures: Visit Diagnoses:  1. Primary osteoarthritis of both knees     Orthovisc #3 bilateral knees, B/B Large Joint Inj: bilateral knee on 09/12/2023 2:32 PM Indications: pain Details: 25 G 1.5 in needle, medial approach  Arthrogram: No  Medications (Right): 1.5 mL lidocaine 1 %; 30 mg Hyaluronan 30 MG/2ML Aspirate (Right): 0 mL Medications (Left): 1.5 mL lidocaine 1 %; 30 mg Hyaluronan 30 MG/2ML Aspirate (Left): 0 mL Outcome: tolerated well, no immediate complications Procedure, treatment alternatives, risks and benefits explained, specific risks discussed. Consent was given by the patient.     Patient tolerated the procedures well.  Aftercare was discussed.  Sherron Ales, PA-C

## 2023-09-25 ENCOUNTER — Other Ambulatory Visit: Payer: Self-pay

## 2023-09-25 ENCOUNTER — Emergency Department (HOSPITAL_BASED_OUTPATIENT_CLINIC_OR_DEPARTMENT_OTHER): Admitting: Radiology

## 2023-09-25 ENCOUNTER — Emergency Department (HOSPITAL_BASED_OUTPATIENT_CLINIC_OR_DEPARTMENT_OTHER)
Admission: EM | Admit: 2023-09-25 | Discharge: 2023-09-25 | Disposition: A | Discharge status: Home / Self Care | Attending: Emergency Medicine | Admitting: Emergency Medicine

## 2023-09-25 ENCOUNTER — Encounter (HOSPITAL_BASED_OUTPATIENT_CLINIC_OR_DEPARTMENT_OTHER): Payer: Self-pay | Admitting: Emergency Medicine

## 2023-09-25 DIAGNOSIS — X501XXA Overexertion from prolonged static or awkward postures, initial encounter: Secondary | ICD-10-CM | POA: Insufficient documentation

## 2023-09-25 DIAGNOSIS — S92351A Displaced fracture of fifth metatarsal bone, right foot, initial encounter for closed fracture: Secondary | ICD-10-CM | POA: Insufficient documentation

## 2023-09-25 DIAGNOSIS — M7731 Calcaneal spur, right foot: Secondary | ICD-10-CM | POA: Diagnosis not present

## 2023-09-25 DIAGNOSIS — S92151A Displaced avulsion fracture (chip fracture) of right talus, initial encounter for closed fracture: Secondary | ICD-10-CM | POA: Diagnosis not present

## 2023-09-25 DIAGNOSIS — S92909A Unspecified fracture of unspecified foot, initial encounter for closed fracture: Secondary | ICD-10-CM

## 2023-09-25 DIAGNOSIS — S92354A Nondisplaced fracture of fifth metatarsal bone, right foot, initial encounter for closed fracture: Secondary | ICD-10-CM | POA: Diagnosis not present

## 2023-09-25 DIAGNOSIS — S99911A Unspecified injury of right ankle, initial encounter: Secondary | ICD-10-CM | POA: Diagnosis present

## 2023-09-25 DIAGNOSIS — S99191A Other physeal fracture of right metatarsal, initial encounter for closed fracture: Secondary | ICD-10-CM

## 2023-09-25 HISTORY — DX: Unspecified fracture of unspecified foot, initial encounter for closed fracture: S92.909A

## 2023-09-25 MED ORDER — TRAMADOL HCL 50 MG PO TABS: 50.0000 mg | ORAL_TABLET | Freq: Four times a day (QID) | ORAL | 0 refills | Status: DC | PRN

## 2023-09-25 NOTE — ED Notes (Signed)
 ED Provider at bedside.

## 2023-09-25 NOTE — Discharge Instructions (Signed)
 Remain Non- weight Bearing until you follow up with orthopedic surgery. Tylenol and ibuprofen for pain. Tramadol when needed. Contact a health care provider if: You have pain that gets worse or does not improve with medicine. You have a fever. You have a bad smell coming from your cast or splint or if the cast or splint gets wet. Get help right away if: You have any of the following in your toes or foot, even after loosening your splint (if you have one): Numbness. Tingling. Coldness. Blue skin. Redness or swelling that gets worse. You have pain that suddenly becomes severe.

## 2023-09-25 NOTE — ED Triage Notes (Signed)
 Fell down 1 step, abolut 6 inches  Rolled right ankle Happened around 3pm Unable to stand on it

## 2023-09-25 NOTE — ED Notes (Signed)
 Patient transported to CT

## 2023-09-25 NOTE — ED Provider Triage Note (Signed)
 Emergency Medicine Provider Triage Evaluation Note  Rachel Mccann , a 68 y.o. female  was evaluated in triage.  Pt complains of R ankle pain after inversion injury ( stepped wrong off a step.).  Review of Systems  Positive: R ankle/foot pain bruising and swelling Negative: Head injury  Physical Exam  There were no vitals taken for this visit. Gen:   Awake, no distress   Resp:  Normal effort  MSK:   Moves extremities without difficulty  Other:  F foot bruising / ankle swelling   Medical Decision Making  Medically screening exam initiated at 4:11 PM.  Appropriate orders placed.  [name] was informed that the remainder of the evaluation will be completed by another provider, this initial triage assessment does not replace that evaluation, and the importance of remaining in the ED until their evaluation is complete.     Arthor Captain, PA-C 09/25/23 [phone]

## 2023-09-25 NOTE — ED Notes (Signed)

## 2023-09-25 NOTE — ED Notes (Signed)
 Family at bedside.

## 2023-09-25 NOTE — ED Provider Notes (Signed)
 [place] EMERGENCY DEPARTMENT AT [facility] Provider Note   CSN: [id number] Arrival date & time: 09/25/23  1544     History  Chief Complaint  Patient presents with   Ankle Pain    Rachel Mccann is a 68 y.o. female who complains of inversion injury to the right ankle and foot 3 hour(s) ago. Immediate symptoms: immediate pain, immediate swelling, inability to bear weight directly after injury. Symptoms have been acute since that time.      Ankle Pain      Home Medications Prior to Admission medications   Medication Sig Start Date End Date Taking? Authorizing Provider  Biotin 1000 MCG tablet Take by mouth. 07/18/16   [provider]  Calcium-Magnesium-Zinc (CAL-MAG-ZINC PO) Take 1 capsule by mouth daily.    [provider]  Cholecalciferol (VITAMIN D3 PO) Take 1 capsule by mouth daily.    [provider]  ibuprofen (ADVIL) 100 MG tablet Take 1 tablet by mouth as needed.    [provider]  naproxen sodium (ALEVE) 220 MG tablet Take 220 mg by mouth.    [provider]  VIVELLE-DOT 0.0375 MG/24HR Apply 1 patch topically 2 (two) times a week.  02/12/17   [provider]      Allergies    Penicillins    Review of Systems   Review of Systems  Physical Exam Updated Vital Signs BP 99/61 (BP Location: Right Arm)   Pulse 65   Temp 98.3 F (36.8 C) (Temporal)   Resp 18   SpO2 96%  Physical Exam Vitals and nursing note reviewed.  Constitutional:      General: She is not in acute distress.    Appearance: She is well-developed. She is not diaphoretic.  HENT:     Head: Normocephalic and atraumatic.     Right Ear: External ear normal.     Left Ear: External ear normal.     Nose: Nose normal.     Mouth/Throat:     Mouth: Mucous membranes are moist.  Eyes:     General: No scleral icterus.    Conjunctiva/sclera: Conjunctivae normal.  Cardiovascular:     Rate and Rhythm: Normal rate and regular rhythm.      Heart sounds: Normal heart sounds. No murmur heard.    No friction rub. No gallop.  Pulmonary:     Effort: Pulmonary effort is normal. No respiratory distress.     Breath sounds: Normal breath sounds.  Abdominal:     General: Bowel sounds are normal. There is no distension.     Palpations: Abdomen is soft. There is no mass.     Tenderness: There is no abdominal tenderness. There is no guarding.  Musculoskeletal:     Cervical back: Normal range of motion.     Comments: R ankle and foot exam. ROM limited due to pain.  Obvious swelling, bruising and tenderness over the Proxmial metatarsal region. NVI  Skin:    General: Skin is warm and dry.  Neurological:     Mental Status: She is alert and oriented to person, place, and time.  Psychiatric:        Behavior: Behavior normal.     ED Results / Procedures / Treatments   Labs (all labs ordered are listed, but only abnormal results are displayed) Labs Reviewed - No data to display  EKG None  Radiology DG Ankle Complete Right Result Date: 09/25/2023 CLINICAL DATA:  Twisting injury today. Pain with limited weight-bearing. EXAM: RIGHT ANKLE -  COMPLETE 3+ VIEW COMPARISON:  Foot radiographs 08/04/2022. FINDINGS: There is an acute mildly displaced intra-articular fracture of the 5th metatarsal base. 2 surgical screw are present within the distal shaft of the 5th metatarsal. No definite acute fracture or dislocation noted at the ankle. Fragmented spurring along the medial malleolus does not appear acute. There are possible tiny avulsion fractures of the lateral aspect of the talar body. There are stable small bidirectional calcaneal spurs. IMPRESSION: 1. Acute mildly displaced intra-articular fracture of the 5th metatarsal base. Correlate with point tenderness and consider foot radiographs. 2. Possible tiny avulsion fractures of the lateral aspect of the talar body. Electronically Signed   By: Carey Bullocks M.D.   On: 09/25/2023 16:58     Procedures Procedures    Medications Ordered in ED Medications - No data to display  ED Course/ Medical Decision Making/ A&P                                 Medical Decision Making Patient with Proximal Fifth metatarsal fracture. Will place in Cam walker. NWB with Crutches.  Will need close f/u. Tramadol and antiinflammatories at dc  Amount and/or Complexity of Data Reviewed Radiology: ordered and independent interpretation performed.    Details: I personally visualized and interpreted the images using our PACS system. Acute findings include:  R proximal 5th metatarsal frx   OP follow up with [facility]         Final Clinical Impression(s) / ED Diagnoses Final diagnoses:  Closed fracture of base of fifth metatarsal bone of right foot at metaphyseal-diaphyseal junction, initial encounter    Rx / DC Orders ED Discharge Orders     None         Arthor Captain, PA-C 09/25/23 1809    Melene Plan, DO 09/25/23 1936

## 2023-10-04 ENCOUNTER — Encounter: Payer: Self-pay | Admitting: Physician Assistant

## 2023-10-04 ENCOUNTER — Ambulatory Visit: Admitting: Physician Assistant

## 2023-10-04 ENCOUNTER — Other Ambulatory Visit (INDEPENDENT_AMBULATORY_CARE_PROVIDER_SITE_OTHER): Payer: Self-pay

## 2023-10-04 DIAGNOSIS — M79671 Pain in right foot: Secondary | ICD-10-CM | POA: Diagnosis not present

## 2023-10-04 DIAGNOSIS — S92351A Displaced fracture of fifth metatarsal bone, right foot, initial encounter for closed fracture: Secondary | ICD-10-CM | POA: Diagnosis not present

## 2023-10-04 NOTE — Progress Notes (Signed)
 HPI: Rachel Mccann comes in today due to right foot fracture.  On March 31 she states that her foot twisted off of the patio when she fell.  She was seen elsewhere and radiographs of her ankle were obtained.  Inadvertently there was fifth metatarsal base fracture that was seen and she was placed in a cam walker boot.  She comes in today for follow-up.  Pains mostly the lateral aspect of the foot sharp achy throbs at times.  She notes swelling and discoloration in the foot.  She is taking Tylenol and Aleve for the pain.  Talus is well located within the ankle mortise without diastases.  Calcifications off the lateral aspect of the talar body felt to be chronic.  No other acute findings.  Radiographs dated 09/25/2023 right ankle 3 views are reviewed.  These show a intra-articular fracture of the fifth metatarsal mildly displaced. Review of systems see HPI otherwise negative  Physical exam: General: Well-developed well-nourished female seated in wheelchair in no acute distress. Respirations: Unlabored on room air Right foot: Ecchymosis over the dorsal and lateral aspect of the foot.  Sensation grossly intact throughout the foot.  There is no impending ulcers.  Tenderness base of the fifth metatarsal no tenting the skin.  Dorsal pedal pulses 2+.  Able to dorsiflex plantarflex the ankle.  Able to invert and evert the foot.  Pain with inversion at the lateral aspect of fifth metatarsal region.  Radiographs: Right foot 3 views: Base of fifth metatarsal intra-articular fracture with superior displacement of the fragment piece.  No other fractures of identified throughout the foot.  2 screws retained in the fifth metatarsal shaft from prior surgery.  Sesamoids are well located.  No subluxations dislocations throughout remainder of the foot.  Slight mortise type foot with the second metatarsal being longer than the first.  Impression: Displaced right fifth metatarsal base intra articular fracture  Plan: Will refer  to Dr. Victorino Dike for further treatment evaluation.  Encouraged elevation of the foot wiggling toes.  Questions were encouraged and answered by Dr. Magnus Ivan and myself.  Dr. Victorino Dike was contacted.  Patient was to be seen later on today by Dr. Victorino Dike.

## 2023-11-08 DIAGNOSIS — S92351A Displaced fracture of fifth metatarsal bone, right foot, initial encounter for closed fracture: Secondary | ICD-10-CM | POA: Diagnosis not present

## 2023-11-28 NOTE — Progress Notes (Signed)
 Office Visit Note  Patient: Rachel Mccann             Date of Birth: 17-Jul-1955           MRN: [id number]             PCP: Thurman Flores, MD Referring: Thurman Flores, MD Visit Date: 12/12/2023 Occupation: @GUAROCC @  Subjective:  Left elbow pain   History of Present Illness: Rachel Mccann is a 68 y.o. female with osteoarthritis and degenerative disc disease.  She returns today after her last visit in October 2024.  She had last viscosupplement injections in her knee joints in March 2025.  She states the knee joint injections are not as effective as they were previously.  She has been having pain and discomfort in her left elbow for the last 6 months.  She was evaluated by Dr. Huntley Mai in the past.  She tried physical therapy without much relief.  She has not had any injuries.  She states she had x-rays with Dr. Huntley Mai.  On March 31 while she was walking in her yard she twisted her foot.  She was seen in the emergency room where she had x-rays and it revealed right fifth metatarsal displaced fracture.  She was evaluated by Dr. Rosebud Confer and was inboot for 2 months.  She states is gradually healing.  She had previous fracture to the same metatarsal in the past.  She continues to have some stiffness in her hands and her neck.  None of the joints are painful.    Activities of Daily Living:  Patient reports morning stiffness for 1 minute.   Patient Denies nocturnal pain.  Difficulty dressing/grooming: Denies Difficulty climbing stairs: Reports Difficulty getting out of chair: Denies Difficulty using hands for taps, buttons, cutlery, and/or writing: Reports  Review of Systems  Constitutional:  Negative for fatigue.  HENT:  Negative for mouth sores and mouth dryness.   Eyes:  Negative for dryness.  Respiratory:  Negative for shortness of breath.   Cardiovascular:  Negative for chest pain and palpitations.  Gastrointestinal:  Negative for blood in stool, constipation and diarrhea.   Endocrine: Negative for increased urination.  Genitourinary:  Negative for involuntary urination.  Musculoskeletal:  Positive for joint pain, joint pain, myalgias, morning stiffness and myalgias. Negative for gait problem, joint swelling, muscle weakness and muscle tenderness.  Skin:  Positive for hair loss. Negative for color change, rash and sensitivity to sunlight.  Allergic/Immunologic: Positive for susceptible to infections.  Neurological:  Negative for dizziness and headaches.  Hematological:  Negative for swollen glands.  Psychiatric/Behavioral:  Positive for sleep disturbance. Negative for depressed mood. The patient is not nervous/anxious.     PMFS History:  Patient Active Problem List   Diagnosis Date Noted   Primary osteoarthritis of both knees 04/14/2020   Primary osteoarthritis of both feet 04/14/2020   Pes cavus 04/14/2020   Osteopenia of multiple sites 04/14/2020   DDD (degenerative disc disease), cervical 04/03/2020   Primary osteoarthritis of both hands 04/03/2020    Past Medical History:  Diagnosis Date   Broken foot 09/25/2023   right   PONV (postoperative nausea and vomiting)     Family History  Problem Relation Age of Onset   Lung cancer Father    Diabetes Sister    Healthy Daughter    Healthy Son    Healthy Son    Past Surgical History:  Procedure Laterality Date   ABDOMINAL HYSTERECTOMY     CESAREAN SECTION  1985, 1987   per patient   COLONOSCOPY WITH PROPOFOL  N/A 04/16/2019   Procedure: COLONOSCOPY WITH PROPOFOL ;  Surgeon: Jolinda Necessary, MD;  Location: [facility];  Service: Endoscopy;  Laterality: N/A;   DENTAL SURGERY     FLEXIBLE SIGMOIDOSCOPY N/A 03/17/2017   Procedure: FLEXIBLE SIGMOIDOSCOPY;  Surgeon: Jolinda Necessary, MD;  Location: [facility];  Service: Endoscopy;  Laterality: N/A;   FLEXIBLE SIGMOIDOSCOPY N/A 08/18/2017   Procedure: FLEXIBLE SIGMOIDOSCOPY;  Surgeon: Jolinda Necessary, MD;  Location: [facility];  Service: Endoscopy;   Laterality: N/A;   FOOT SURGERY Right 2010   HOT HEMOSTASIS N/A 03/17/2017   Procedure: HOT HEMOSTASIS (ARGON PLASMA COAGULATION/BICAP);  Surgeon: Jolinda Necessary, MD;  Location: [facility];  Service: Endoscopy;  Laterality: N/A;   KNEE SURGERY Right 2005   POLYPECTOMY  04/16/2019   Procedure: POLYPECTOMY;  Surgeon: Jolinda Necessary, MD;  Location: [facility];  Service: Endoscopy;;   Social History   Social History Narrative   Not on file   Immunization History  Administered Date(s) Administered   PFIZER(Purple Top)SARS-COV-2 Vaccination 07/16/2019, 08/06/2019, 03/24/2020     Objective: Vital Signs: BP 105/69 (BP Location: Right Arm, Patient Position: Sitting, Cuff Size: Normal)   Pulse 62   Resp 14   Ht 5' 6.5 (1.689 m)   Wt 173 lb (78.5 kg)   BMI 27.50 kg/m    Physical Exam Vitals and nursing note reviewed.  Constitutional:      Appearance: She is well-developed.  HENT:     Head: Normocephalic and atraumatic.   Eyes:     Conjunctiva/sclera: Conjunctivae normal.    Cardiovascular:     Rate and Rhythm: Normal rate and regular rhythm.     Heart sounds: Normal heart sounds.  Pulmonary:     Effort: Pulmonary effort is normal.     Breath sounds: Normal breath sounds.  Abdominal:     General: Bowel sounds are normal.     Palpations: Abdomen is soft.   Musculoskeletal:     Cervical back: Normal range of motion.  Lymphadenopathy:     Cervical: No cervical adenopathy.   Skin:    General: Skin is warm and dry.     Capillary Refill: Capillary refill takes less than 2 seconds.   Neurological:     Mental Status: She is alert and oriented to person, place, and time.   Psychiatric:        Behavior: Behavior normal.      Musculoskeletal Exam: Cervical spine was in good range of motion.  Shoulders, elbows, wrist joints were in good range of motion.  Bilateral PIP and DIP thickening was noted.  Tenderness was noted over left lateral epicondyle region.  Hip joints and  knee joints in good range of motion.  There was no tenderness over ankles or MTPs.  CDAI Exam: CDAI Score: -- Patient Global: --; Provider Global: -- Swollen: --; Tender: -- Joint Exam 12/12/2023   No joint exam has been documented for this visit   There is currently no information documented on the homunculus. Go to the Rheumatology activity and complete the homunculus joint exam.  Investigation: No additional findings.  Imaging: No results found.  Recent Labs: Lab Results  Component Value Date   WBC 5.7 03/18/2020   HGB 14.1 03/18/2020   PLT 266 03/18/2020   NA 140 03/18/2020   K 4.6 03/18/2020   CL 105 03/18/2020   CO2 27 03/18/2020   GLUCOSE 85 03/18/2020   BUN 23 03/18/2020   CREATININE 0.89  03/18/2020   BILITOT 0.2 03/18/2020   AST 15 03/18/2020   ALT 13 03/18/2020   PROT 6.4 03/18/2020   CALCIUM 9.4 03/18/2020   GFRAA 78 03/18/2020   10/04/23:  Right foot 3 views: Base of fifth metatarsal intra-articular fracture  with superior displacement of the fragment piece.  No other fractures of  identified throughout the foot.  2 screws retained in the fifth metatarsal  shaft from prior surgery.  Sesamoids are well located.  No subluxations  dislocations throughout remainder of the foot.  Slight mortise type foot  with the second metatarsal being longer than the first.  -Dr. Rosebud Confer  Right foot 3 views: Base of fifth metatarsal intra-articular fracture  with superior displacement of the fragment piece.  No other fractures of  identified throughout the foot.  2 screws retained in the fifth metatarsal  shaft from prior surgery.  Sesamoids are well located.  No subluxations  dislocations throughout remainder of the foot.  Slight mortise type foot  with the second metatarsal being longer than the first.  Speciality Comments: No specialty comments available.  Procedures:  Medium Joint Inj: L lateral epicondyle on 12/12/2023 10:55 AM Indications: pain Details: 27 G 1.5  in needle, posterior approach Medications: 1 mL lidocaine  1 %; 30 mg triamcinolone  acetonide 40 MG/ML Aspirate: 0 mL Outcome: tolerated well, no immediate complications  Risk of infection, tendon injury, nerve injury, dermal atrophy and hypopigmentation were discussed. Procedure, treatment alternatives, risks and benefits explained, specific risks discussed. Consent was given by the patient. Immediately prior to procedure a time out was called to verify the correct patient, procedure, equipment, support staff and site/side marked as required. Patient was prepped and draped in the usual sterile fashion.     Allergies: Penicillins   Assessment / Plan:     Visit Diagnoses: Lateral epicondylitis, left elbow-patient has been experiencing recurrent pain in her left lateral epicondyle region for the last 6 months.  She tried physical therapy without any help.  She has been using a tennis elbow brace.  I discussed the option of left lateral epicondyle injection.  Side effects were discussed.  Patient wanted to proceed with the injection.  After informed consent was obtained left lateral epicondyle region was injected with lidocaine  and Kenalog  as described above.  Patient tolerated the procedure well.  Postprocedure instructions were given.  A handout on forearm exercises was given.  Primary osteoarthritis of both hands-she complains of the stiffness and discomfort in her hands.  No synovitis was noted.  The pain is better during the summer months.  Paresthesia of both hands-she is mild intermittent symptoms.  Primary osteoarthritis of both knees -she had good response to s/p orthovisc bilateral knees 08/2023.  She would like to schedule repeat injections in 6 months.  Lower extremity muscle strength exercises were discussed.  Primary osteoarthritis of both feet-she has intermittent discomfort.  No synovitis was noted.  Closed displaced fracture of fifth metatarsal bone of right foot, sequela-patient  injured her right foot on September 25, 2023.  She had a closed displaced fracture.  She was in a boot for about 2 months.  She is gradually recovering from it.  Pes cavus-arch support was advised.  DDD (degenerative disc disease), cervical -she continues to have some stiffness in her neck.  She good range of motion.  History of spinal stenosis and facet joint arthropathy. She is a previous patient of Dr. Nudelman. Sees a chiropractor PRN.  Osteopenia of multiple sites - Followed by Dr.  Grewal.  I do not have DEXA results to review.  Use of calcium rich diet, vitamin D  and resistive exercises were discussed.  Patient will bring DEXA scan results at the follow-up visit.  Orders: Orders Placed This Encounter  Procedures   Medium Joint Inj   No orders of the defined types were placed in this encounter.    Follow-Up Instructions: Return in about 6 months (around 06/12/2024) for Osteoarthritis.   Nicholas Bari, MD  Note - This record has been created using [employer]ist.  Chart creation errors have been sought, but may not always  have been located. Such creation errors do not reflect on  the standard of medical care.

## 2023-12-04 DIAGNOSIS — M25522 Pain in left elbow: Secondary | ICD-10-CM | POA: Diagnosis not present

## 2023-12-04 DIAGNOSIS — L821 Other seborrheic keratosis: Secondary | ICD-10-CM | POA: Diagnosis not present

## 2023-12-04 DIAGNOSIS — L738 Other specified follicular disorders: Secondary | ICD-10-CM | POA: Diagnosis not present

## 2023-12-04 DIAGNOSIS — L814 Other melanin hyperpigmentation: Secondary | ICD-10-CM | POA: Diagnosis not present

## 2023-12-04 DIAGNOSIS — G8929 Other chronic pain: Secondary | ICD-10-CM | POA: Diagnosis not present

## 2023-12-04 DIAGNOSIS — M7712 Lateral epicondylitis, left elbow: Secondary | ICD-10-CM | POA: Diagnosis not present

## 2023-12-06 DIAGNOSIS — S92351D Displaced fracture of fifth metatarsal bone, right foot, subsequent encounter for fracture with routine healing: Secondary | ICD-10-CM | POA: Diagnosis not present

## 2023-12-12 ENCOUNTER — Ambulatory Visit: Payer: PPO | Attending: Rheumatology | Admitting: Rheumatology

## 2023-12-12 ENCOUNTER — Encounter: Payer: Self-pay | Admitting: Rheumatology

## 2023-12-12 ENCOUNTER — Telehealth: Payer: Self-pay | Admitting: *Deleted

## 2023-12-12 VITALS — BP 105/69 | HR 62 | Resp 14 | Ht 66.5 in | Wt 173.0 lb

## 2023-12-12 DIAGNOSIS — M7712 Lateral epicondylitis, left elbow: Secondary | ICD-10-CM

## 2023-12-12 DIAGNOSIS — M19071 Primary osteoarthritis, right ankle and foot: Secondary | ICD-10-CM

## 2023-12-12 DIAGNOSIS — M5030 Other cervical disc degeneration, unspecified cervical region: Secondary | ICD-10-CM

## 2023-12-12 DIAGNOSIS — M19041 Primary osteoarthritis, right hand: Secondary | ICD-10-CM | POA: Diagnosis not present

## 2023-12-12 DIAGNOSIS — S92351S Displaced fracture of fifth metatarsal bone, right foot, sequela: Secondary | ICD-10-CM | POA: Diagnosis not present

## 2023-12-12 DIAGNOSIS — R202 Paresthesia of skin: Secondary | ICD-10-CM | POA: Diagnosis not present

## 2023-12-12 DIAGNOSIS — M170 Bilateral primary osteoarthritis of knee: Secondary | ICD-10-CM

## 2023-12-12 DIAGNOSIS — M19042 Primary osteoarthritis, left hand: Secondary | ICD-10-CM

## 2023-12-12 DIAGNOSIS — Q667 Congenital pes cavus, unspecified foot: Secondary | ICD-10-CM | POA: Diagnosis not present

## 2023-12-12 DIAGNOSIS — M19072 Primary osteoarthritis, left ankle and foot: Secondary | ICD-10-CM | POA: Diagnosis not present

## 2023-12-12 DIAGNOSIS — M8589 Other specified disorders of bone density and structure, multiple sites: Secondary | ICD-10-CM

## 2023-12-12 MED ORDER — LIDOCAINE HCL 1 % IJ SOLN
1.0000 mL | INTRAMUSCULAR | Status: AC | PRN
  Administered 2023-12-12: 1 mL

## 2023-12-12 MED ORDER — TRIAMCINOLONE ACETONIDE 40 MG/ML IJ SUSP
30.0000 mg | INTRAMUSCULAR | Status: AC | PRN
  Administered 2023-12-12: 30 mg via INTRA_ARTICULAR

## 2023-12-12 NOTE — Telephone Encounter (Signed)
 Please apply for visco bilateral 6 months from 08/2023. Thank you.

## 2023-12-12 NOTE — Patient Instructions (Signed)
 Exercises for Elbow and Forearm Elbow and forearm exercises can help you get better after an injury or health problem. Only do the exercises you were told to do. Make sure you know how to do the exercises safely. Follow the steps below. It's normal to feel mild discomfort. Stop if you feel pain or your pain gets worse. Do not start these exercises until told by your health care provider. Range-of-motion exercises These exercises warm up your muscles and joints. They can help your elbow and forearm move better and be more flexible. These exercises are done using the muscles in your injured elbow and forearm. Elbow flexion, active  Hold your left / right arm at your side. Bend your elbow as far as you can using only your arm muscles. Hold this position for __________ seconds. Slowly go back to the starting position. Repeat __________ times. Do this exercise __________ times a day. Elbow extension, active  Hold your left / right arm at your side. Straighten your elbow as much as you can using only your arm muscles. Hold this position for __________ seconds. Slowly go back to the starting position. Repeat __________ times. Do this exercise __________ times a day. Forearm rotation, supination This is an exercise in which you turn your forearm palm-up. Stand or sit with your elbows at your sides. Bend your left / right elbow to a 90-degree angle (right angle). Position your forearm so that your thumb faces the ceiling. Turn your palm up toward the ceiling until you feel a gentle stretch on the inside of your forearm. If told, use your other hand to help turn your forearm farther until you feel a gentle to moderate stretch. Hold this position for __________ seconds. Slowly go back to the starting position. Repeat __________ times. Do this exercise __________ times a day. Forearm rotation, pronation This is an exercise in which you turn your forearm palm-down. Stand or sit with your elbows at  your sides. Bend your left / right elbow to a 90-degree angle. Position your forearm so that your thumb faces the ceiling. Turn your palm down until you feel a gentle stretch on the top of your forearm. If told, use your other hand to help turn your forearm farther until you feel a gentle to moderate stretch. Hold this position for __________ seconds. Slowly go back to the starting position. Repeat __________ times. Do this exercise __________ times a day. Stretching exercises These exercises warm up your muscles and joints. They're done using your healthy elbow and forearm to help stretch the muscles in your injured elbow and forearm. Elbow flexion, active-assisted  Hold your left / right arm at your side. Bend your elbow as much as you can using your left / right arm muscles. Use your other hand to bend your left / right elbow farther. Gently push up on your forearm until you feel a gentle stretch on the back of your elbow. Hold this position for __________ seconds. Slowly go back to the starting position. Repeat __________ times. Do this exercise __________ times a day. Elbow extension, active-assisted  Hold your left / right arm at your side. Straighten your elbow as much as you can using your left / right arm muscles. Use your other hand to straighten the left / right elbow farther. Gently push down on your forearm until you feel a gentle stretch on the inside of your elbow. Hold this position for __________ seconds. Slowly go back to the starting position. Repeat __________ times. Do  this exercise __________ times a day. Passive elbow flexion, supine  Lie on your back. Lift your left / right arm up in the air, bracing it with your other hand. Let your left / right hand slowly lower toward your shoulder. Keep your elbow pointed toward the ceiling. You should feel a gentle stretch along the back of your upper arm and elbow. If told, you may add a small wrist weight or hand weight to  increase the stretch. Hold this position for __________ seconds. Slowly go back to the starting position. Repeat __________ times. Do this exercise __________ times a day. Passive elbow extension, supine  Lie on your back. Make sure you're comfortable and can relax your arm muscles. Place a folded towel under your left / right upper arm so your elbow and shoulder are at the same height. Straighten your left / right arm so your elbow doesn't rest on the bed or towel. Let the weight of your hand stretch your elbow. Keep your arm and chest muscles relaxed. You should feel a stretch on the inside of your elbow. If told, you may add a small wrist weight or hand weight to increase the stretch. Hold this position for __________ seconds. Slowly release the stretch. Repeat __________ times. Do this exercise __________ times a day. Strengthening exercises These exercises build strength and endurance in your elbow and forearm. Endurance is the ability to use your muscles for a long time, even after they get tired. Elbow flexion, isometric  Stand or sit up straight. Bend your left / right elbow in a 90-degree angle. Keep your forearm at the height of your waist. Your thumb should point toward the ceiling. Place your other hand on top of your left / right forearm. Gently push down while you resist with your left / right arm. Push as hard as you can with both arms without causing any pain or movement at your left / right elbow. Hold this position for __________ seconds. Slowly release the tension in both arms. Let your muscles fully relax. Repeat __________ times. Do this exercise __________ times a day. Elbow extension, isometric  Stand or sit up straight. Place your left / right arm so your palm faces your belly and is at the height of your waist. Place your other hand on the underside of your left / right forearm. Gently push up while you resist with your left / right arm. Push as hard as you can  with both arms without causing any pain or movement at your left / right elbow. Hold this position for __________ seconds. Slowly release the tension in both arms. Let your muscles fully relax. Repeat __________ times. Do this exercise __________ times a day. Elbow flexion with forearm palm up  Sit on a firm chair without armrests or stand up. Place your left / right arm at your side with your elbow straight and your palm facing forward. Holding a __________ weight or gripping a rubber exercise band or tubing, bend your elbow to bring your hand toward your shoulder. Hold this position for __________ seconds. Slowly go back to the starting position. Repeat __________ times. Do this exercise __________ times a day. Elbow extension, active  Sit on a firm chair without armrests or stand up. Hold a rubber exercise band or tubing in both hands. Keep your upper arms at your sides. Bring both hands up to your left / right shoulder. Keep your left / right hand just below your other hand. Straighten your left /  right elbow. Keep your other arm still. Hold this position for __________ seconds. Control the resistance of the band or tubing as you go back to the starting position. Repeat __________ times. Do this exercise __________ times a day. Forearm rotation with weight, supination  Sit with your left / right forearm supported on a table. Your elbow should be at waist height and bent at a 90-degree angle. Gently grasp a lightweight hammer. Rest your hand over the edge of the table with your palm facing down. Without moving your left / right elbow, slowly turn your forearm to turn your palm up toward the ceiling. Hold this position for __________ seconds. Slowly go back to the starting position. Repeat __________ times. Do this exercise __________ times a day. Forearm rotation with weight, pronation  Sit with your left / right forearm supported on a table. Keep your elbow below shoulder  height. Gently grasp a lightweight hammer. Rest your hand over the edge of the table with your palm facing up. Without moving your left / right elbow, slowly turn your forearm to turn your palm down toward the floor. Hold this position for __________ seconds. Slowly go back to the starting position. Repeat __________ times. Do this exercise __________ times a day. This information is not intended to replace advice given to you by your health care provider. Make sure you discuss any questions you have with your health care provider. Document Revised: 01/05/2023 Document Reviewed: 01/05/2023 Elsevier Patient Education  2024 [employer].

## 2024-02-14 ENCOUNTER — Telehealth: Payer: Self-pay | Admitting: Rheumatology

## 2024-02-14 NOTE — Telephone Encounter (Signed)
 We can apply for bilateral viscosupplement injections 6 months after the previous Visco supplement injections.

## 2024-02-14 NOTE — Telephone Encounter (Signed)
 Pt would like to reapply for BIL visco injections. Pt notify she would have to wait till September to receive them if approved.

## 2024-03-04 NOTE — Telephone Encounter (Signed)
 VOB submitted for Orthovisc, Bilateral knee(s) BV pending

## 2024-03-05 NOTE — Telephone Encounter (Signed)
 Please call to schedule visco injections.  Approved for Orthovisc, Bilateral knee(s). Buy & Bill $20 Co-pay Deductible does not apply Once the OOP has been met $3400 (met [phone]) patient is covered at 100% Prior authorization not required Ref [phone]

## 2024-03-20 ENCOUNTER — Ambulatory Visit (INDEPENDENT_AMBULATORY_CARE_PROVIDER_SITE_OTHER): Admitting: Podiatry

## 2024-03-20 ENCOUNTER — Encounter: Payer: Self-pay | Admitting: Podiatry

## 2024-03-20 ENCOUNTER — Ambulatory Visit (INDEPENDENT_AMBULATORY_CARE_PROVIDER_SITE_OTHER)

## 2024-03-20 DIAGNOSIS — M7752 Other enthesopathy of left foot: Secondary | ICD-10-CM | POA: Diagnosis not present

## 2024-03-20 DIAGNOSIS — M21619 Bunion of unspecified foot: Secondary | ICD-10-CM | POA: Diagnosis not present

## 2024-03-20 NOTE — Progress Notes (Signed)
 Subjective:   Patient ID: Rachel Mccann, female   DOB: 68 y.o.   MRN: [id number]   HPI Patient presents with discomfort of times and swelling of the big toes left with history of bunion deformity and other pathology.  Patient is noted to have no other changes in health history and does not smoke   ROS      Objective:  Physical Exam  Neurovascular status intact with inflammation fluid around the inner phalangeal joint of the left big toe that is painful with moderate hyperostosis medial aspect first metatarsal head bilateral with keratotic tissue formation.  Good digital perfusion well-oriented x 3     Assessment:  Inflammatory changes of the inner phalangeal joint left hallux which may be due to subtle trauma or other pathology     Plan:  H&P reviewed and discussed x-ray results indicating no signs of arthritis of the inner phalangeal joint from a bony standpoint but clinically it does have a small amount of crepitus with structural HAV deformity known.  Patient at this point is going on hold off any treatment may require interphalangeal joint injection if swelling were to increase or pain were to increase

## 2024-03-27 DIAGNOSIS — H524 Presbyopia: Secondary | ICD-10-CM | POA: Diagnosis not present

## 2024-03-27 DIAGNOSIS — H2513 Age-related nuclear cataract, bilateral: Secondary | ICD-10-CM | POA: Diagnosis not present

## 2024-03-27 DIAGNOSIS — H43813 Vitreous degeneration, bilateral: Secondary | ICD-10-CM | POA: Diagnosis not present

## 2024-04-03 ENCOUNTER — Ambulatory Visit: Attending: Physician Assistant | Admitting: Physician Assistant

## 2024-04-03 DIAGNOSIS — M170 Bilateral primary osteoarthritis of knee: Secondary | ICD-10-CM | POA: Diagnosis not present

## 2024-04-03 MED ORDER — LIDOCAINE HCL 1 % IJ SOLN
1.5000 mL | INTRAMUSCULAR | Status: AC | PRN
  Administered 2024-04-03: 1.5 mL

## 2024-04-03 MED ORDER — HYALURONAN 30 MG/2ML IX SOSY
30.0000 mg | PREFILLED_SYRINGE | INTRA_ARTICULAR | Status: AC | PRN
  Administered 2024-04-03: 30 mg via INTRA_ARTICULAR

## 2024-04-03 NOTE — Progress Notes (Signed)
   Procedure Note  Patient: Rachel Mccann             Date of Birth: 03-Mar-1956           MRN: [id number]             Visit Date: 04/03/2024  Procedures: Visit Diagnoses:  1. Primary osteoarthritis of both knees     #1 Orthovisc Bilateral Knees B/B  Large Joint Inj: bilateral knee on 04/03/2024 9:10 AM Indications: pain Details: 25 G 1.5 in needle, medial approach  Arthrogram: No  Medications (Right): 1.5 mL lidocaine  1 %; 30 mg Hyaluronan 30 MG/2ML Aspirate (Right): 0 mL Medications (Left): 1.5 mL lidocaine  1 %; 30 mg Hyaluronan 30 MG/2ML Aspirate (Left): 0 mL Outcome: tolerated well, no immediate complications Procedure, treatment alternatives, risks and benefits explained, specific risks discussed. Consent was given by the patient.       Patient tolerated the procedures well.  Aftercare was discussed.  Waddell Craze, PA-C

## 2024-04-10 ENCOUNTER — Ambulatory Visit: Attending: Physician Assistant | Admitting: Physician Assistant

## 2024-04-10 DIAGNOSIS — M170 Bilateral primary osteoarthritis of knee: Secondary | ICD-10-CM | POA: Diagnosis not present

## 2024-04-10 MED ORDER — HYALURONAN 30 MG/2ML IX SOSY
30.0000 mg | PREFILLED_SYRINGE | INTRA_ARTICULAR | Status: AC | PRN
  Administered 2024-04-10: 30 mg via INTRA_ARTICULAR

## 2024-04-10 MED ORDER — LIDOCAINE HCL 1 % IJ SOLN
1.5000 mL | INTRAMUSCULAR | Status: AC | PRN
  Administered 2024-04-10: 1.5 mL

## 2024-04-10 NOTE — Progress Notes (Signed)
   Procedure Note  Patient: Rachel Mccann             Date of Birth: 02-18-1956           MRN: [id number]             Visit Date: 04/10/2024  Procedures: Visit Diagnoses:  1. Primary osteoarthritis of both knees     #2 Orthovisc Bilateral Knees B/B  Large Joint Inj: bilateral knee on 04/10/2024 8:42 AM Indications: pain Details: 25 G 1.5 in needle, medial approach  Arthrogram: No  Medications (Right): 1.5 mL lidocaine  1 %; 30 mg Hyaluronan 30 MG/2ML Aspirate (Right): 0 mL Medications (Left): 1.5 mL lidocaine  1 %; 30 mg Hyaluronan 30 MG/2ML Aspirate (Left): 0 mL Outcome: tolerated well, no immediate complications Procedure, treatment alternatives, risks and benefits explained, specific risks discussed. Consent was given by the patient.       Patient tolerated the procedures well.  Aftercare was discussed.  Waddell Craze, PA-C

## 2024-04-17 ENCOUNTER — Ambulatory Visit: Attending: Physician Assistant | Admitting: Physician Assistant

## 2024-04-17 DIAGNOSIS — M170 Bilateral primary osteoarthritis of knee: Secondary | ICD-10-CM

## 2024-04-17 MED ORDER — LIDOCAINE HCL 1 % IJ SOLN
1.5000 mL | INTRAMUSCULAR | Status: AC | PRN
  Administered 2024-04-17: 1.5 mL

## 2024-04-17 MED ORDER — HYALURONAN 30 MG/2ML IX SOSY
30.0000 mg | PREFILLED_SYRINGE | INTRA_ARTICULAR | Status: AC | PRN
  Administered 2024-04-17: 30 mg via INTRA_ARTICULAR

## 2024-04-17 NOTE — Progress Notes (Signed)
   Procedure Note  Patient: Rachel Mccann             Date of Birth: 1955/07/02           MRN: [id number]             Visit Date: 04/17/2024  Procedures: Visit Diagnoses:  1. Primary osteoarthritis of both knees     Orthovisc #3 bilateral knees, B/B Large Joint Inj: bilateral knee on 04/17/2024 8:53 AM Indications: pain Details: 25 G 1.5 in needle, medial approach  Arthrogram: No  Medications (Right): 1.5 mL lidocaine  1 %; 30 mg Hyaluronan 30 MG/2ML Aspirate (Right): 0 mL Medications (Left): 1.5 mL lidocaine  1 %; 30 mg Hyaluronan 30 MG/2ML Aspirate (Left): 0 mL Outcome: tolerated well, no immediate complications Procedure, treatment alternatives, risks and benefits explained, specific risks discussed. Consent was given by the patient.       Patient tolerated the procedures well. Aftercare was discussed.  Waddell Craze, PA-C

## 2024-04-29 ENCOUNTER — Encounter: Payer: Self-pay | Admitting: Radiology

## 2024-09-12 ENCOUNTER — Ambulatory Visit: Admitting: Rheumatology
